# Patient Record
Sex: Male | Born: 1945
Health system: Southern US, Community
[De-identification: ages and names within clinical notes are randomized; demographics above are authoritative.]

## PROBLEM LIST (undated history)

## (undated) DIAGNOSIS — H269 Unspecified cataract: Secondary | ICD-10-CM

## (undated) DIAGNOSIS — J309 Allergic rhinitis, unspecified: Secondary | ICD-10-CM

## (undated) DIAGNOSIS — K469 Unspecified abdominal hernia without obstruction or gangrene: Secondary | ICD-10-CM

## (undated) DIAGNOSIS — E785 Hyperlipidemia, unspecified: Secondary | ICD-10-CM

## (undated) DIAGNOSIS — G4733 Obstructive sleep apnea (adult) (pediatric): Secondary | ICD-10-CM

## (undated) DIAGNOSIS — G4730 Sleep apnea, unspecified: Secondary | ICD-10-CM

## (undated) DIAGNOSIS — J45909 Unspecified asthma, uncomplicated: Secondary | ICD-10-CM

## (undated) DIAGNOSIS — J302 Other seasonal allergic rhinitis: Secondary | ICD-10-CM

## (undated) DIAGNOSIS — K219 Gastro-esophageal reflux disease without esophagitis: Secondary | ICD-10-CM

## (undated) DIAGNOSIS — F329 Major depressive disorder, single episode, unspecified: Secondary | ICD-10-CM

## (undated) DIAGNOSIS — F32A Depression, unspecified: Secondary | ICD-10-CM

## (undated) HISTORY — PX: CATARACT EXTRACTION, BILATERAL: SHX1313

## (undated) HISTORY — PX: FOOT SURGERY: SHX648

## (undated) HISTORY — DX: Gastro-esophageal reflux disease without esophagitis: K21.9

## (undated) HISTORY — DX: Unspecified abdominal hernia without obstruction or gangrene: K46.9

## (undated) HISTORY — DX: Other seasonal allergic rhinitis: J30.2

## (undated) HISTORY — PX: APPENDECTOMY: SHX54

## (undated) HISTORY — DX: Unspecified cataract: H26.9

## (undated) HISTORY — DX: Hyperlipidemia, unspecified: E78.5

## (undated) HISTORY — DX: Obstructive sleep apnea (adult) (pediatric): G47.33

## (undated) HISTORY — DX: Major depressive disorder, single episode, unspecified: F32.9

## (undated) HISTORY — DX: Allergic rhinitis, unspecified: J30.9

## (undated) HISTORY — PX: HERNIA REPAIR: SHX51

## (undated) HISTORY — DX: Unspecified asthma, uncomplicated: J45.909

## (undated) HISTORY — DX: Sleep apnea, unspecified: G47.30

## (undated) HISTORY — DX: Depression, unspecified: F32.A

---

## 1991-01-05 HISTORY — PX: THYROGLOSSAL DUCT CYST: SHX297

## 2010-02-17 ENCOUNTER — Ambulatory Visit: Payer: Self-pay | Admitting: Internal Medicine

## 2010-02-17 DIAGNOSIS — J019 Acute sinusitis, unspecified: Secondary | ICD-10-CM

## 2010-02-17 DIAGNOSIS — J309 Allergic rhinitis, unspecified: Secondary | ICD-10-CM

## 2010-08-27 ENCOUNTER — Encounter: Payer: Self-pay | Admitting: Internal Medicine

## 2010-08-27 ENCOUNTER — Ambulatory Visit (INDEPENDENT_AMBULATORY_CARE_PROVIDER_SITE_OTHER): Payer: Medicare Other | Admitting: Internal Medicine

## 2010-08-27 DIAGNOSIS — K4090 Unilateral inguinal hernia, without obstruction or gangrene, not specified as recurrent: Secondary | ICD-10-CM

## 2010-08-27 DIAGNOSIS — J309 Allergic rhinitis, unspecified: Secondary | ICD-10-CM

## 2010-08-27 DIAGNOSIS — G4730 Sleep apnea, unspecified: Secondary | ICD-10-CM

## 2010-08-27 DIAGNOSIS — E785 Hyperlipidemia, unspecified: Secondary | ICD-10-CM

## 2010-08-27 DIAGNOSIS — N400 Enlarged prostate without lower urinary tract symptoms: Secondary | ICD-10-CM

## 2010-08-27 DIAGNOSIS — F3289 Other specified depressive episodes: Secondary | ICD-10-CM

## 2010-08-27 DIAGNOSIS — F32A Depression, unspecified: Secondary | ICD-10-CM

## 2010-08-27 DIAGNOSIS — F329 Major depressive disorder, single episode, unspecified: Secondary | ICD-10-CM

## 2010-09-01 ENCOUNTER — Other Ambulatory Visit (INDEPENDENT_AMBULATORY_CARE_PROVIDER_SITE_OTHER): Payer: Self-pay | Admitting: Otolaryngology

## 2010-09-03 ENCOUNTER — Ambulatory Visit
Admission: RE | Admit: 2010-09-03 | Discharge: 2010-09-03 | Disposition: A | Discharge status: Home / Self Care | Payer: Medicare Other | Source: Ambulatory Visit | Attending: Otolaryngology | Admitting: Otolaryngology

## 2010-09-03 ENCOUNTER — Inpatient Hospital Stay: Admission: RE | Admit: 2010-09-03 | Payer: Medicare Other | Source: Ambulatory Visit

## 2010-09-27 DIAGNOSIS — E785 Hyperlipidemia, unspecified: Secondary | ICD-10-CM | POA: Insufficient documentation

## 2010-09-27 DIAGNOSIS — G4733 Obstructive sleep apnea (adult) (pediatric): Secondary | ICD-10-CM | POA: Insufficient documentation

## 2010-09-27 DIAGNOSIS — N400 Enlarged prostate without lower urinary tract symptoms: Secondary | ICD-10-CM | POA: Insufficient documentation

## 2010-09-27 DIAGNOSIS — J309 Allergic rhinitis, unspecified: Secondary | ICD-10-CM | POA: Insufficient documentation

## 2010-09-27 NOTE — Progress Notes (Signed)
  Subjective:    Patient ID: Jeffrey Wu., male    DOB: 1945/11/21, 65 y.o.   MRN: [id number]  HPI 65 year old white male who denies his time between here and his home in [place] presents with complaint of groin pain. Is under the care of Dr. Corinda Gubler for chronic sinusitis and allergic rhinitis. He is retired. Used to take allergy injections. Was evaluated initially February 2012 with positive skin test to dust mite, slight reaction to grass, significant reaction to red cedar and weeds and tree mix. Has been prescribed Nasonex nasal spray. Had thyroglossal duct cyst removed around 1990. Appendectomy 1955. Hernia repair 2009. Currently on Paxil and Crestor. Had sigmoidoscopy approximately 1990 in [place]. History of obstructive sleep apnea for which CPAP as been prescribed by his physician in [place]. History of depression and hyperlipidemia and recurrent urination at night. Previous hernia was in the right inguinal area according to old records. History of BPH for which Flomax has been prescribed in the past. Quit smoking July 2009. Smoked a pack a day for some 46 years. Social alcohol consumption consisting of 3 ounces of alcohol daily for approximately 50 years. He has a Event organiser in business. He is married. Has 3 adult children 2 sons and a daughter.  Father died at age 61 of leukemia. Patient was 65 years old at the time. Mother died at age 65 due to a stroke after heart surgery. One brother in good health. One sister in good health. Wife is in good health. They're planning to move here to live permanently in [place] whenever they get their house in [place] downsized. Patient's mother had history of diabetes. Their son and daughter-in-law live here now.  Patient has noticed  discomfort in the inguinal region.    Review of Systems     Objective:   Physical Exam fullness in inguinal area. Hernia felt on direct palpation.        Assessment & Plan:  Inguinal  hernia  Hyperlipidemia  Depression  BPH  Allergic rhinitis  Sleep apnea  Plan: Refer to surgeon for further evaluation

## 2010-12-09 HISTORY — PX: NASAL SINUS SURGERY: SHX719

## 2010-12-22 ENCOUNTER — Encounter: Payer: Self-pay | Admitting: Internal Medicine

## 2010-12-22 ENCOUNTER — Ambulatory Visit (INDEPENDENT_AMBULATORY_CARE_PROVIDER_SITE_OTHER): Payer: Medicare Other | Admitting: Internal Medicine

## 2010-12-22 DIAGNOSIS — J309 Allergic rhinitis, unspecified: Secondary | ICD-10-CM

## 2010-12-22 DIAGNOSIS — K4090 Unilateral inguinal hernia, without obstruction or gangrene, not specified as recurrent: Secondary | ICD-10-CM

## 2010-12-22 DIAGNOSIS — G4730 Sleep apnea, unspecified: Secondary | ICD-10-CM

## 2010-12-22 NOTE — Patient Instructions (Signed)
We will make appointment for you to see Dr. Shelle Iron for sleep apnea, please contact [facility] to have [facility]AP apparatus checked. We will make an appointment for you to see general surgeon regarding inguinal hernia.

## 2010-12-22 NOTE — Progress Notes (Signed)
Patient ID: Jeffrey Wu., male   DOB: 02/01/1945, 65 y.o.   MRN: [id number]   Patient has not been seen here since August. In the interim, he has had allergy testing at [facility]. His wife is here today for an office visit and she relates to me that he has several requests. He apparently recently had sinus surgery because sinus infections could not be cleared up with medication per allergist. When he was seen in August, he was complaining of inguinal hernia discomfort. He is now ready to see surgeon about this. History of right inguinal hernia repair 2009. We will arrange for consultation after the first of the year. He also has a history of sleep apnea. He has moved here from [place] where CPAP was originally prescribed and does not feel that the CPAP apparatus is working correctly. I have given his wife today an order for them to call [facility] and have an evaluation of the CPAP apparatus. We will also make a consultation appointment for him to see Dr. Shelle Iron regarding his sleep apnea.

## 2010-12-23 ENCOUNTER — Telehealth: Payer: Self-pay

## 2010-12-23 DIAGNOSIS — G4730 Sleep apnea, unspecified: Secondary | ICD-10-CM

## 2010-12-23 DIAGNOSIS — K4090 Unilateral inguinal hernia, without obstruction or gangrene, not specified as recurrent: Secondary | ICD-10-CM

## 2010-12-23 NOTE — Telephone Encounter (Signed)
Patient scheduled for appointment with Dr. Loretha Brasil on January 10,2013 at 3:20 pm for evaluation of inguinal hernias, and appointment with Dr. Shelle Iron on January 06, 2011 at 4:00pm for sleep apnea. Patient aware of these appointments.

## 2011-01-06 ENCOUNTER — Ambulatory Visit (INDEPENDENT_AMBULATORY_CARE_PROVIDER_SITE_OTHER): Payer: Medicare Other | Admitting: Pulmonary Disease

## 2011-01-06 ENCOUNTER — Encounter: Payer: Self-pay | Admitting: Pulmonary Disease

## 2011-01-06 VITALS — BP 110/76 | HR 60 | Temp 98.2°F | Ht 72.0 in | Wt 219.2 lb

## 2011-01-06 DIAGNOSIS — G4733 Obstructive sleep apnea (adult) (pediatric): Secondary | ICD-10-CM

## 2011-01-06 DIAGNOSIS — F329 Major depressive disorder, single episode, unspecified: Secondary | ICD-10-CM

## 2011-01-06 DIAGNOSIS — F32A Depression, unspecified: Secondary | ICD-10-CM

## 2011-01-06 DIAGNOSIS — F3289 Other specified depressive episodes: Secondary | ICD-10-CM

## 2011-01-06 NOTE — Patient Instructions (Signed)
Will get you setup with a dme for supplies and service Will reoptimize your cpap pressure at home with an auto device, and will call you with results.  Work on weight loss followup with me in 1 year if doing well.

## 2011-01-06 NOTE — Progress Notes (Signed)
  Subjective:    Patient ID: Jeffrey Mealy., male    DOB: Apr 21, 1945, 66 y.o.   MRN: [id number]  HPI The patient is a 66 year old male who had been asked to see for management of obstructive sleep apnea.  He was diagnosed around 2010 in [place] with sleep apnea, and has been on CPAP since that time.  He uses a full face mask, and his current mask is only 59 months old.  He is unaware of his current CPAP pressure.  He feels that he has done very well with the device, and that it has helped his sleep and daytime alertness.  The last few months he does not feel as rested in the mornings upon arising, but has had sleep onset issues since moving to the area.  He feels that his alertness during the day is quite adequate with reading or watching television, but can have issues with the computer.  He denies any issues with sleepiness while driving.  The patient states that his weight is about 25 pounds up since his sleep study, and his Epworth score today is 7.  Sleep Questionnaire: What time do you typically go to bed?( Between what hours) 11pm How long does it take you to fall asleep? varies How many times during the night do you wake up? 2 What time do you get out of bed to start your day? 0700 Do you drive or operate heavy machinery in your occupation? No How much has your weight changed (up or down) over the past two years? (In pounds) 15 lb (6.804 kg) Have you ever had a sleep study before? Yes If yes, location of study? [facility] in [place], [place] If yes, date of study? 2010 Do you currently use CPAP? Yes If so, what pressure? unsure Do you [place] oxygen at any time? No     Review of Systems  Constitutional: Negative for fever and unexpected weight change.  HENT: Positive for ear pain, congestion and sneezing. Negative for nosebleeds, sore throat, rhinorrhea, trouble swallowing, dental problem, postnasal drip and sinus pressure.   Eyes: Negative for redness and itching.  Respiratory: Negative  for cough, chest tightness, shortness of breath and wheezing.   Cardiovascular: Negative for palpitations and leg swelling.  Gastrointestinal: Negative for nausea and vomiting.  Genitourinary: Negative for dysuria.  Musculoskeletal: Negative for joint swelling.  Skin: Negative for rash.  Neurological: Negative for headaches.  Hematological: Does not bruise/bleed easily.  Psychiatric/Behavioral: Negative for dysphoric mood. The patient is not nervous/anxious.        Objective:   Physical Exam Constitutional:  Well developed, no acute distress  HENT:  Nares patent without discharge, mild crusting on left  Oropharynx without exudate, palate and uvula are elongated.  Eyes:  Perrla, eomi, no scleral icterus  Neck:  No JVD, no TMG  Cardiovascular:  Normal rate, regular rhythm, no rubs or gallops.  No murmurs        Intact distal pulses  Pulmonary :  Normal breath sounds, no stridor or respiratory distress   No rales, rhonchi, or wheezing  Abdominal:  Soft, nondistended, bowel sounds present.  No tenderness noted.   Musculoskeletal:  No lower extremity edema noted.  Lymph Nodes:  No cervical lymphadenopathy noted  Skin:  No cyanosis noted  Neurologic:  Alert, appropriate, moves all 4 extremities without obvious deficit.         Assessment & Plan:

## 2011-01-06 NOTE — Assessment & Plan Note (Signed)
The patient has a history of objective sleep apnea, and feels that he has been treated successfully with CPAP.  He currently feels that his alertness during the day is adequate, but does have some sleepiness at times.  His weight has increased 25 pounds or more since his last sleep study, and I would like to take this opportunity to optimize his pressure again.  Otherwise, he feels he is doing well with the device, and I have encouraged him to work aggressively on weight loss.  He also needs to keep up with the CPAP supplies and mask changes.

## 2011-01-08 ENCOUNTER — Telehealth: Payer: Self-pay | Admitting: Pulmonary Disease

## 2011-01-08 NOTE — Telephone Encounter (Signed)
Received copies from [facility],on 01/08/11. Forwarded 10pages to Dr. Reva Bores review.

## 2011-01-13 DIAGNOSIS — H6520 Chronic serous otitis media, unspecified ear: Secondary | ICD-10-CM | POA: Diagnosis not present

## 2011-01-13 DIAGNOSIS — J320 Chronic maxillary sinusitis: Secondary | ICD-10-CM | POA: Diagnosis not present

## 2011-01-13 DIAGNOSIS — J330 Polyp of nasal cavity: Secondary | ICD-10-CM | POA: Diagnosis not present

## 2011-01-13 DIAGNOSIS — H6980 Other specified disorders of Eustachian tube, unspecified ear: Secondary | ICD-10-CM | POA: Diagnosis not present

## 2011-01-13 DIAGNOSIS — H908 Mixed conductive and sensorineural hearing loss, unspecified: Secondary | ICD-10-CM | POA: Diagnosis not present

## 2011-01-14 ENCOUNTER — Ambulatory Visit (INDEPENDENT_AMBULATORY_CARE_PROVIDER_SITE_OTHER): Payer: Medicare Other | Admitting: Surgery

## 2011-01-14 ENCOUNTER — Encounter (INDEPENDENT_AMBULATORY_CARE_PROVIDER_SITE_OTHER): Payer: Self-pay | Admitting: Surgery

## 2011-01-14 VITALS — BP 132/84 | HR 64 | Temp 96.8°F | Resp 16 | Ht 73.0 in | Wt 211.8 lb

## 2011-01-14 DIAGNOSIS — K429 Umbilical hernia without obstruction or gangrene: Secondary | ICD-10-CM | POA: Diagnosis not present

## 2011-01-14 DIAGNOSIS — K4091 Unilateral inguinal hernia, without obstruction or gangrene, recurrent: Secondary | ICD-10-CM

## 2011-01-14 NOTE — Progress Notes (Signed)
Patient ID: Jeffrey Wu., male   DOB: 02/18/1945, 66 y.o.   MRN: [id number]  Chief Complaint  Patient presents with  . Other    new pt- eval hernias    HPI Jeffrey Wu. is a 66 y.o. male.   HPIThis is a very pleasant gentleman referred by Dr. Lenord Fellers for evaluation of her recurrent inguinal hernia. The patient reports he had inguinal hernia repair several years ago and has now recurred. He is now also noticed a bulge in his umbilicus. He is having moderate discomfort from time to time in the right groin. The bulge always easily reduces and is quite small. He has had no nausea or vomiting or obstructive symptoms. He is otherwise without complaints  Past Medical History  Diagnosis Date  . Atopic rhinitis   . Chronic sinusitis   . Hyperlipidemia   . OSA (obstructive sleep apnea)   . Hernia   . Hemorrhoids     Past Surgical History  Procedure Date  . Appendectomy   . Hernia repair   . Foot surgery     Left  . Nasal sinus surgery 12/09/10  . Thyroglossal duct cyst 1993    Family History  Problem Relation Age of Onset  . Cancer Father     leukemia  . Heart disease Mother   . Stroke Mother     Social History History  Substance Use Topics  . Smoking status: Former Smoker -- 1.0 packs/day for 50 years    Types: Cigarettes    Quit date: 01/05/2007  . Smokeless tobacco: Not on file  . Alcohol Use: Yes    No Known Allergies  Current Outpatient Prescriptions  Medication Sig Dispense Refill  . azelastine (ASTELIN) 137 MCG/SPRAY nasal spray Place 1 spray into the nose daily. Use in each nostril as directed      . Dutasteride-Tamsulosin HCl (JALYN PO) Take by mouth daily.       . fexofenadine (ALLEGRA) 180 MG tablet Take 180 mg by mouth daily.        . methylPREDNISolone (MEDROL) 16 MG tablet Take 16 mg by mouth daily.      . mometasone (NASONEX) 50 MCG/ACT nasal spray Place 2 sprays into the nose daily.        Marland Kitchen PARoxetine (PAXIL) 10 MG tablet Take 10 mg by mouth  every morning.        . rosuvastatin (CRESTOR) 10 MG tablet Take 10 mg by mouth daily.          Review of Systems Review of Systems  Constitutional: Negative for fever, chills and unexpected weight change.  HENT: Negative for hearing loss, congestion, sore throat, trouble swallowing and voice change.   Eyes: Negative for visual disturbance.  Respiratory: Negative for cough and wheezing.   Cardiovascular: Negative for chest pain, palpitations and leg swelling.  Gastrointestinal: Negative for nausea, vomiting, abdominal pain, diarrhea, constipation, blood in stool, abdominal distention, anal bleeding and rectal pain.  Genitourinary: Negative for hematuria and difficulty urinating.  Musculoskeletal: Negative for arthralgias.  Skin: Negative for rash and wound.  Neurological: Negative for seizures, syncope, weakness and headaches.  Hematological: Negative for adenopathy. Does not bruise/bleed easily.  Psychiatric/Behavioral: Negative for confusion.    Blood pressure 132/84, pulse 64, temperature 96.8 F (36 C), temperature source Temporal, resp. rate 16, height 6\' 1"  (1.854 m), weight 211 lb 12.8 oz (96.072 kg).  Physical Exam Physical Exam  Constitutional: He is oriented to person, place, and time. He appears  well-developed and well-nourished. No distress.  HENT:  Head: Normocephalic and atraumatic.  Right Ear: External ear normal.  Left Ear: External ear normal.  Nose: Nose normal.  Mouth/Throat: Oropharynx is clear and moist.  Eyes: Conjunctivae and EOM are normal. Pupils are equal, round, and reactive to light. No scleral icterus.  Neck: Normal range of motion. Neck supple. No tracheal deviation present. No thyromegaly present.  Cardiovascular: Normal rate, regular rhythm, normal heart sounds and intact distal pulses.   No murmur heard. Pulmonary/Chest: Effort normal and breath sounds normal.  Abdominal: Soft. Bowel sounds are normal. He exhibits no distension. There is no  tenderness. There is no rebound.       He has a small reducible hernia at the umbilicus. There is also a small recurrent right inguinal hernia. I cannot palpate a left inguinal hernia  Musculoskeletal: Normal range of motion. He exhibits no edema and no tenderness.  Lymphadenopathy:    He has no cervical adenopathy.  Neurological: He is alert and oriented to person, place, and time.  Skin: Skin is warm and dry. No rash noted. He is not diaphoretic. No pallor.  Psychiatric: His behavior is normal. Judgment normal.    Data Reviewed   Assessment    Recurrent right inguinal hernia and umbilical hernia    Plan    Repair with mesh was recommended for the inguinal hernia. As it is recurrent I am recommending laparoscopic repair. We will also fixed the umbilicus at that same time. I discussed the risk of surgery which includes but is not limited to bleeding, infection, nerve entrapment with chronic pain, recurrence, injury to surrounding structures, etc. He will call back to schedule surgery. Likelihood of success is good       Deneise Getty A 01/14/2011, 4:14 PM

## 2011-01-20 DIAGNOSIS — H6980 Other specified disorders of Eustachian tube, unspecified ear: Secondary | ICD-10-CM | POA: Diagnosis not present

## 2011-01-20 DIAGNOSIS — H908 Mixed conductive and sensorineural hearing loss, unspecified: Secondary | ICD-10-CM | POA: Diagnosis not present

## 2011-01-20 DIAGNOSIS — H6520 Chronic serous otitis media, unspecified ear: Secondary | ICD-10-CM | POA: Diagnosis not present

## 2011-01-29 ENCOUNTER — Encounter: Payer: Self-pay | Admitting: Pulmonary Disease

## 2011-02-15 ENCOUNTER — Telehealth: Payer: Self-pay | Admitting: Internal Medicine

## 2011-02-15 ENCOUNTER — Other Ambulatory Visit: Payer: Self-pay

## 2011-02-15 DIAGNOSIS — E785 Hyperlipidemia, unspecified: Secondary | ICD-10-CM

## 2011-02-15 MED ORDER — ROSUVASTATIN CALCIUM 10 MG PO TABS: 10.0000 mg | ORAL_TABLET | Freq: Every day | ORAL | Status: DC

## 2011-02-15 MED ORDER — PAROXETINE HCL 10 MG PO TABS: 10.0000 mg | ORAL_TABLET | ORAL | Status: DC

## 2011-02-15 NOTE — Telephone Encounter (Signed)
Call in # 30 of each with 2 refills. Pt must be seen here for lipid liver and OV when he returns.

## 2011-02-28 ENCOUNTER — Other Ambulatory Visit: Payer: Self-pay | Admitting: Pulmonary Disease

## 2011-02-28 DIAGNOSIS — G4733 Obstructive sleep apnea (adult) (pediatric): Secondary | ICD-10-CM

## 2011-03-11 ENCOUNTER — Telehealth (INDEPENDENT_AMBULATORY_CARE_PROVIDER_SITE_OTHER): Payer: Self-pay | Admitting: Surgery

## 2011-03-11 NOTE — Telephone Encounter (Signed)
Patient was last seen on 01/14/11 for BIH, did not wish to schedule sx at that time, Dr. Dorthea Cove instructed the patient to call and schedule sx when ready.  Don't know if there are any orders or if any orders can be written for sx, or if the patient needs to come back and see Dr. Dorthea Cove again.  Patient is in [place] and will be back in [place] on 04/05/11.

## 2011-03-19 DIAGNOSIS — R0989 Other specified symptoms and signs involving the circulatory and respiratory systems: Secondary | ICD-10-CM | POA: Diagnosis not present

## 2011-03-19 DIAGNOSIS — J3489 Other specified disorders of nose and nasal sinuses: Secondary | ICD-10-CM | POA: Diagnosis not present

## 2011-03-19 DIAGNOSIS — R0602 Shortness of breath: Secondary | ICD-10-CM | POA: Diagnosis not present

## 2011-03-19 DIAGNOSIS — R062 Wheezing: Secondary | ICD-10-CM | POA: Diagnosis not present

## 2011-04-06 ENCOUNTER — Other Ambulatory Visit: Payer: Self-pay | Admitting: Internal Medicine

## 2011-04-06 NOTE — Telephone Encounter (Signed)
When is patient booked to come in? He is past due and we recently refilled meds to out of state home where he also resides. Needs OV in next several weeks to continue to refill meds. No PE available until May or JUne.

## 2011-04-07 ENCOUNTER — Other Ambulatory Visit: Payer: Self-pay

## 2011-04-07 DIAGNOSIS — J3089 Other allergic rhinitis: Secondary | ICD-10-CM | POA: Diagnosis not present

## 2011-04-07 DIAGNOSIS — J019 Acute sinusitis, unspecified: Secondary | ICD-10-CM | POA: Diagnosis not present

## 2011-04-07 DIAGNOSIS — J301 Allergic rhinitis due to pollen: Secondary | ICD-10-CM | POA: Diagnosis not present

## 2011-04-07 DIAGNOSIS — R0602 Shortness of breath: Secondary | ICD-10-CM | POA: Diagnosis not present

## 2011-04-08 ENCOUNTER — Other Ambulatory Visit: Payer: Self-pay

## 2011-04-08 MED ORDER — DUTASTERIDE-TAMSULOSIN HCL 0.5-0.4 MG PO CAPS: 0.4000 mg | ORAL_CAPSULE | Freq: Every day | ORAL | Status: DC

## 2011-04-12 ENCOUNTER — Other Ambulatory Visit: Payer: Medicare Other | Admitting: Internal Medicine

## 2011-04-12 DIAGNOSIS — Z79899 Other long term (current) drug therapy: Secondary | ICD-10-CM | POA: Diagnosis not present

## 2011-04-12 DIAGNOSIS — N400 Enlarged prostate without lower urinary tract symptoms: Secondary | ICD-10-CM

## 2011-04-12 DIAGNOSIS — E785 Hyperlipidemia, unspecified: Secondary | ICD-10-CM | POA: Diagnosis not present

## 2011-04-12 DIAGNOSIS — H7200 Central perforation of tympanic membrane, unspecified ear: Secondary | ICD-10-CM | POA: Diagnosis not present

## 2011-04-12 DIAGNOSIS — J320 Chronic maxillary sinusitis: Secondary | ICD-10-CM | POA: Diagnosis not present

## 2011-04-12 DIAGNOSIS — H903 Sensorineural hearing loss, bilateral: Secondary | ICD-10-CM | POA: Diagnosis not present

## 2011-04-12 DIAGNOSIS — Z125 Encounter for screening for malignant neoplasm of prostate: Secondary | ICD-10-CM | POA: Diagnosis not present

## 2011-04-12 DIAGNOSIS — H6980 Other specified disorders of Eustachian tube, unspecified ear: Secondary | ICD-10-CM | POA: Diagnosis not present

## 2011-04-12 LAB — COMPREHENSIVE METABOLIC PANEL
ALT: 23 U/L (ref 0–53)
AST: 20 U/L (ref 0–37)
BUN: 16 mg/dL (ref 6–23)
Calcium: 9.4 mg/dL (ref 8.4–10.5)
Creat: 1.03 mg/dL (ref 0.50–1.35)
Total Bilirubin: 0.5 mg/dL (ref 0.3–1.2)

## 2011-04-12 LAB — CBC WITH DIFFERENTIAL/PLATELET
Basophils Absolute: 0 10*3/uL (ref 0.0–0.1)
Eosinophils Absolute: 0.2 10*3/uL (ref 0.0–0.7)
Eosinophils Relative: 2 % (ref 0–5)
HCT: 46.6 % (ref 39.0–52.0)
MCH: 30.9 pg (ref 26.0–34.0)
MCHC: 31.5 g/dL (ref 30.0–36.0)
MCV: 97.9 fL (ref 78.0–100.0)
Monocytes Absolute: 0.7 10*3/uL (ref 0.1–1.0)
Platelets: 187 10*3/uL (ref 150–400)
RDW: 13.5 % (ref 11.5–15.5)

## 2011-04-12 LAB — LIPID PANEL
Cholesterol: 197 mg/dL (ref 0–200)
HDL: 75 mg/dL (ref >39)
Total CHOL/HDL Ratio: 2.6 Ratio
VLDL: 18 mg/dL (ref 0–40)

## 2011-04-12 LAB — PSA, MEDICARE: PSA: 0.37 ng/mL (ref <=4.00)

## 2011-04-13 ENCOUNTER — Encounter: Payer: Self-pay | Admitting: Internal Medicine

## 2011-04-13 ENCOUNTER — Ambulatory Visit (INDEPENDENT_AMBULATORY_CARE_PROVIDER_SITE_OTHER): Payer: Medicare Other | Admitting: Internal Medicine

## 2011-04-13 VITALS — BP 128/68 | HR 60 | Temp 98.1°F | Ht 72.0 in | Wt 220.0 lb

## 2011-04-13 DIAGNOSIS — J309 Allergic rhinitis, unspecified: Secondary | ICD-10-CM

## 2011-04-13 DIAGNOSIS — F329 Major depressive disorder, single episode, unspecified: Secondary | ICD-10-CM

## 2011-04-13 DIAGNOSIS — G4733 Obstructive sleep apnea (adult) (pediatric): Secondary | ICD-10-CM

## 2011-04-13 DIAGNOSIS — I10 Essential (primary) hypertension: Secondary | ICD-10-CM | POA: Diagnosis not present

## 2011-04-13 DIAGNOSIS — F3289 Other specified depressive episodes: Secondary | ICD-10-CM

## 2011-04-13 DIAGNOSIS — Z23 Encounter for immunization: Secondary | ICD-10-CM

## 2011-04-13 DIAGNOSIS — N400 Enlarged prostate without lower urinary tract symptoms: Secondary | ICD-10-CM

## 2011-04-13 DIAGNOSIS — E785 Hyperlipidemia, unspecified: Secondary | ICD-10-CM

## 2011-04-13 DIAGNOSIS — F32A Depression, unspecified: Secondary | ICD-10-CM

## 2011-04-13 MED ORDER — PNEUMOCOCCAL VAC POLYVALENT 25 MCG/0.5ML IJ INJ: 0.5000 mL | INJECTION | INTRAMUSCULAR | Status: AC

## 2011-04-14 ENCOUNTER — Other Ambulatory Visit: Payer: Self-pay | Admitting: Internal Medicine

## 2011-04-22 DIAGNOSIS — K429 Umbilical hernia without obstruction or gangrene: Secondary | ICD-10-CM | POA: Diagnosis not present

## 2011-04-22 DIAGNOSIS — K4090 Unilateral inguinal hernia, without obstruction or gangrene, not specified as recurrent: Secondary | ICD-10-CM | POA: Diagnosis not present

## 2011-04-22 HISTORY — PX: INGUINAL HERNIA REPAIR: SUR1180

## 2011-04-26 ENCOUNTER — Encounter (INDEPENDENT_AMBULATORY_CARE_PROVIDER_SITE_OTHER): Payer: Self-pay

## 2011-04-27 ENCOUNTER — Telehealth: Payer: Self-pay | Admitting: Pulmonary Disease

## 2011-04-27 NOTE — Telephone Encounter (Signed)
Would be happy to look at download if you can track down.  Thanks.

## 2011-04-27 NOTE — Telephone Encounter (Signed)
Called, spoke with [name] who states pt came in today for a pressure change to 15.  [name] seen a note for a download, so he done this first.  Per [name], in the last 30 days, the AHI = 3.3 with pressure at 9.  [name] reports that pt was having high leaks from mask not fitting properly during titration study.  Therefore, today he refitted pt for a mask and tubing and did NOT change pressure to 15.  He is requesting KC look at this download, which [name] states he did send, and advise if KC would still like pressure to be changed.  Dr. Shelle Iron, pls advise.  Thank you.

## 2011-04-27 NOTE — Telephone Encounter (Signed)
Called and spoke with Jeffrey Wu and he is going to fax download to triage fax #. Awaiting fax.

## 2011-04-28 ENCOUNTER — Encounter (INDEPENDENT_AMBULATORY_CARE_PROVIDER_SITE_OTHER): Payer: Self-pay | Admitting: General Surgery

## 2011-04-28 ENCOUNTER — Ambulatory Visit (INDEPENDENT_AMBULATORY_CARE_PROVIDER_SITE_OTHER): Payer: Medicare Other | Admitting: Pulmonary Disease

## 2011-04-28 DIAGNOSIS — G4733 Obstructive sleep apnea (adult) (pediatric): Secondary | ICD-10-CM

## 2011-04-28 NOTE — Telephone Encounter (Signed)
Now that he has a mask that fits, I think he needs to have another auto study on 5-20cm for 2 weeks with download to me. I will send an order to [facility].  Please let pt know.

## 2011-04-28 NOTE — Telephone Encounter (Signed)
Received download and put in [facility] folder

## 2011-04-28 NOTE — Progress Notes (Signed)
Order only

## 2011-04-28 NOTE — Telephone Encounter (Signed)
ATC pt- na wcb 

## 2011-04-28 NOTE — Telephone Encounter (Signed)
I don't see download on triage fax. Aundra Millet did you receive the download. Please advise thanks

## 2011-04-29 NOTE — Telephone Encounter (Signed)
Spoke with pt and notified of recs per KC He verbalized understanding and denied any questions 

## 2011-04-29 NOTE — Telephone Encounter (Signed)
LMTCB

## 2011-05-02 NOTE — Patient Instructions (Signed)
Continue same medications and make appointment for physical examination in 6 months. Please try to determine when her last colonoscopy was in [place] that report.

## 2011-05-02 NOTE — Progress Notes (Signed)
  Subjective:    Patient ID: Jeffrey Wu., male    DOB: 01/25/1945, 66 y.o.   MRN: [id number]  HPI 66 year old white male who lives here in [place] now but has lived in both [place] and [place] for the past year or so. Has been trying to sell home and [place]. He is married. History of BPH, depression, hypertension. Has allergic rhinitis and sees allergist at [facility]. Patient says he has had a colonoscopy and [place] but I do not have that report. He says no polyps were found in says study was done around 1990. I've asked him to try to track that report down for me with the date and findings. History of obstructive sleep apnea uses CPAP. This was diagnosed in 2009 in [place], [place]. He is a former smoker. Quit smoking around 2010 and formerly smoked a pack a day for some 46 years. Consumes 3 ounces of alcohol daily for some 50 years.  Was allergy tested here in [facility] February 2012 his skin test were positive to dust mite, grass, red cedar and weeds. Also allergic to tree mix on intradermal testing.  Father died at age 66 of cancer. Says it was from leukemia and patient was 66 years old at the time. Mother died at age 64 of a stroke after heart surgery. One brother and one sister in good health. 3 adult children 2 sons and a daughter all of whom are in good health. He has grandchildren.   Review of Systems     Objective:   Physical Exam neck is supple without JVD thyromegaly or carotid bruits. Chest clear to auscultation. Cardiac exam regular rate and rhythm normal S1 and S2; extremities without edema.         Assessment & Plan:  Hypertension  BPH  Depression  Allergic rhinitis  Obstructive sleep apnea  Plan: Patient was told [facility] appointment for physical examination in 6 months. We have refilled his chronic medications today but he needs to have fasting lab work. Given tetanus immunization in left arm and Pneumovax immunization in right arm  today.

## 2011-05-04 ENCOUNTER — Encounter (INDEPENDENT_AMBULATORY_CARE_PROVIDER_SITE_OTHER): Payer: Self-pay | Admitting: Surgery

## 2011-05-04 ENCOUNTER — Ambulatory Visit (INDEPENDENT_AMBULATORY_CARE_PROVIDER_SITE_OTHER): Payer: Medicare Other | Admitting: Surgery

## 2011-05-04 VITALS — BP 122/68 | HR 78 | Resp 18 | Ht 73.0 in | Wt 215.0 lb

## 2011-05-04 DIAGNOSIS — Z09 Encounter for follow-up examination after completed treatment for conditions other than malignant neoplasm: Secondary | ICD-10-CM

## 2011-05-04 NOTE — Progress Notes (Signed)
Subjective:     Patient ID: Jeffrey Wu., male   DOB: 04/03/1945, 66 y.o.   MRN: [id number]  HPI He is here for his first postop visit status post laparoscopic right inguinal hernia repair with mesh as well as umbilical hernia repair. He is doing well and has no complaints.  Review of Systems     Objective:   Physical Exam On exam, his incisions are healing well. There is no evidence of recurrent hernia at either site    Assessment:     Patient doing well status post hernia repair    Plan:     He will refrain for heavy lifting for another week and a half. I will see him back as needed

## 2011-05-10 DIAGNOSIS — D1801 Hemangioma of skin and subcutaneous tissue: Secondary | ICD-10-CM | POA: Diagnosis not present

## 2011-05-10 DIAGNOSIS — L570 Actinic keratosis: Secondary | ICD-10-CM | POA: Diagnosis not present

## 2011-05-10 DIAGNOSIS — L820 Inflamed seborrheic keratosis: Secondary | ICD-10-CM | POA: Diagnosis not present

## 2011-05-11 DIAGNOSIS — J45909 Unspecified asthma, uncomplicated: Secondary | ICD-10-CM | POA: Diagnosis not present

## 2011-05-11 DIAGNOSIS — J301 Allergic rhinitis due to pollen: Secondary | ICD-10-CM | POA: Diagnosis not present

## 2011-05-11 DIAGNOSIS — R0602 Shortness of breath: Secondary | ICD-10-CM | POA: Diagnosis not present

## 2011-05-11 DIAGNOSIS — J3089 Other allergic rhinitis: Secondary | ICD-10-CM | POA: Diagnosis not present

## 2011-06-08 DIAGNOSIS — R05 Cough: Secondary | ICD-10-CM | POA: Diagnosis not present

## 2011-06-08 DIAGNOSIS — J019 Acute sinusitis, unspecified: Secondary | ICD-10-CM | POA: Diagnosis not present

## 2011-06-08 DIAGNOSIS — R059 Cough, unspecified: Secondary | ICD-10-CM | POA: Diagnosis not present

## 2011-06-13 ENCOUNTER — Other Ambulatory Visit: Payer: Self-pay | Admitting: Internal Medicine

## 2011-06-15 DIAGNOSIS — J209 Acute bronchitis, unspecified: Secondary | ICD-10-CM | POA: Diagnosis not present

## 2011-06-15 DIAGNOSIS — J069 Acute upper respiratory infection, unspecified: Secondary | ICD-10-CM | POA: Diagnosis not present

## 2011-06-18 DIAGNOSIS — J438 Other emphysema: Secondary | ICD-10-CM | POA: Diagnosis not present

## 2011-06-18 DIAGNOSIS — J309 Allergic rhinitis, unspecified: Secondary | ICD-10-CM | POA: Diagnosis not present

## 2011-06-18 DIAGNOSIS — R0609 Other forms of dyspnea: Secondary | ICD-10-CM | POA: Diagnosis not present

## 2011-06-18 DIAGNOSIS — J209 Acute bronchitis, unspecified: Secondary | ICD-10-CM | POA: Diagnosis not present

## 2011-07-14 DIAGNOSIS — B999 Unspecified infectious disease: Secondary | ICD-10-CM | POA: Diagnosis not present

## 2011-07-14 DIAGNOSIS — J301 Allergic rhinitis due to pollen: Secondary | ICD-10-CM | POA: Diagnosis not present

## 2011-07-14 DIAGNOSIS — J45901 Unspecified asthma with (acute) exacerbation: Secondary | ICD-10-CM | POA: Diagnosis not present

## 2011-07-14 DIAGNOSIS — J3089 Other allergic rhinitis: Secondary | ICD-10-CM | POA: Diagnosis not present

## 2011-07-14 DIAGNOSIS — J45909 Unspecified asthma, uncomplicated: Secondary | ICD-10-CM | POA: Diagnosis not present

## 2011-07-14 DIAGNOSIS — J019 Acute sinusitis, unspecified: Secondary | ICD-10-CM | POA: Diagnosis not present

## 2011-07-27 DIAGNOSIS — B999 Unspecified infectious disease: Secondary | ICD-10-CM | POA: Diagnosis not present

## 2011-08-05 DIAGNOSIS — J45909 Unspecified asthma, uncomplicated: Secondary | ICD-10-CM | POA: Diagnosis not present

## 2011-08-05 DIAGNOSIS — J3089 Other allergic rhinitis: Secondary | ICD-10-CM | POA: Diagnosis not present

## 2011-08-05 DIAGNOSIS — B999 Unspecified infectious disease: Secondary | ICD-10-CM | POA: Diagnosis not present

## 2011-08-05 DIAGNOSIS — J301 Allergic rhinitis due to pollen: Secondary | ICD-10-CM | POA: Diagnosis not present

## 2011-08-08 ENCOUNTER — Other Ambulatory Visit: Payer: Self-pay | Admitting: Pulmonary Disease

## 2011-08-08 DIAGNOSIS — G4733 Obstructive sleep apnea (adult) (pediatric): Secondary | ICD-10-CM

## 2011-08-11 ENCOUNTER — Telehealth: Payer: Self-pay | Admitting: Pulmonary Disease

## 2011-08-11 DIAGNOSIS — G4733 Obstructive sleep apnea (adult) (pediatric): Secondary | ICD-10-CM

## 2011-08-11 NOTE — Telephone Encounter (Signed)
Spoke with Jeffrey Wu, In the process of changing CPAP pressure to 12cm as ordered, pts machine messed up and will not allow the unit to be set lower than 25. [facility] has supplied a loner machine to pt until we can get an Rx/order sent in for pt to have a new replacement machine; set at 12cm with humidifer. [name] also states that they need the Rx/order to be signed by [facility] and have his license number attached.  Order placed for replacement machine and routed to [facility].

## 2011-08-11 NOTE — Telephone Encounter (Signed)
Order given to [facility] to replace cpap and set on 12cm and get climate controlled tubing Tobe Sos

## 2011-08-13 DIAGNOSIS — B999 Unspecified infectious disease: Secondary | ICD-10-CM | POA: Diagnosis not present

## 2011-09-03 DIAGNOSIS — H2510 Age-related nuclear cataract, unspecified eye: Secondary | ICD-10-CM | POA: Diagnosis not present

## 2011-09-03 DIAGNOSIS — H524 Presbyopia: Secondary | ICD-10-CM | POA: Diagnosis not present

## 2011-09-03 DIAGNOSIS — H5200 Hypermetropia, unspecified eye: Secondary | ICD-10-CM | POA: Diagnosis not present

## 2011-09-03 DIAGNOSIS — H52229 Regular astigmatism, unspecified eye: Secondary | ICD-10-CM | POA: Diagnosis not present

## 2011-09-09 ENCOUNTER — Other Ambulatory Visit: Payer: Self-pay | Admitting: Internal Medicine

## 2011-09-13 DIAGNOSIS — J45909 Unspecified asthma, uncomplicated: Secondary | ICD-10-CM | POA: Diagnosis not present

## 2011-09-13 DIAGNOSIS — J301 Allergic rhinitis due to pollen: Secondary | ICD-10-CM | POA: Diagnosis not present

## 2011-09-13 DIAGNOSIS — J3089 Other allergic rhinitis: Secondary | ICD-10-CM | POA: Diagnosis not present

## 2011-09-13 DIAGNOSIS — B999 Unspecified infectious disease: Secondary | ICD-10-CM | POA: Diagnosis not present

## 2011-10-07 ENCOUNTER — Other Ambulatory Visit: Payer: Self-pay | Admitting: Internal Medicine

## 2011-10-11 DIAGNOSIS — Z23 Encounter for immunization: Secondary | ICD-10-CM | POA: Diagnosis not present

## 2011-10-19 ENCOUNTER — Other Ambulatory Visit: Payer: Medicare Other | Admitting: Internal Medicine

## 2011-10-19 DIAGNOSIS — E785 Hyperlipidemia, unspecified: Secondary | ICD-10-CM | POA: Diagnosis not present

## 2011-10-19 DIAGNOSIS — N400 Enlarged prostate without lower urinary tract symptoms: Secondary | ICD-10-CM | POA: Diagnosis not present

## 2011-10-19 DIAGNOSIS — Z79899 Other long term (current) drug therapy: Secondary | ICD-10-CM

## 2011-10-19 DIAGNOSIS — Z125 Encounter for screening for malignant neoplasm of prostate: Secondary | ICD-10-CM

## 2011-10-19 LAB — CBC WITH DIFFERENTIAL/PLATELET
Basophils Absolute: 0 10*3/uL (ref 0.0–0.1)
Basophils Relative: 0 % (ref 0–1)
HCT: 45.1 % (ref 39.0–52.0)
MCHC: 33 g/dL (ref 30.0–36.0)
Monocytes Absolute: 0.5 10*3/uL (ref 0.1–1.0)
Neutro Abs: 3.8 10*3/uL (ref 1.7–7.7)
Neutrophils Relative %: 49 % (ref 43–77)
Platelets: 161 10*3/uL (ref 150–400)
RDW: 12.6 % (ref 11.5–15.5)

## 2011-10-19 LAB — COMPREHENSIVE METABOLIC PANEL
AST: 20 U/L (ref 0–37)
Albumin: 3.6 g/dL (ref 3.5–5.2)
Alkaline Phosphatase: 95 U/L (ref 39–117)
BUN: 12 mg/dL (ref 6–23)
Potassium: 4.2 mEq/L (ref 3.5–5.3)

## 2011-10-19 LAB — LIPID PANEL
Cholesterol: 156 mg/dL (ref 0–200)
HDL: 46 mg/dL
LDL Cholesterol: 89 mg/dL (ref 0–99)
Total CHOL/HDL Ratio: 3.4 ratio
Triglycerides: 103 mg/dL
VLDL: 21 mg/dL (ref 0–40)

## 2011-10-19 LAB — PSA, MEDICARE: PSA: 0.33 ng/mL

## 2011-10-21 ENCOUNTER — Ambulatory Visit (INDEPENDENT_AMBULATORY_CARE_PROVIDER_SITE_OTHER): Payer: Medicare Other | Admitting: Internal Medicine

## 2011-10-21 ENCOUNTER — Encounter: Payer: Self-pay | Admitting: Internal Medicine

## 2011-10-21 VITALS — BP 110/60 | HR 72 | Temp 97.5°F | Resp 12 | Ht 72.25 in | Wt 213.0 lb

## 2011-10-21 DIAGNOSIS — M542 Cervicalgia: Secondary | ICD-10-CM | POA: Diagnosis not present

## 2011-10-21 DIAGNOSIS — J309 Allergic rhinitis, unspecified: Secondary | ICD-10-CM | POA: Diagnosis not present

## 2011-10-21 DIAGNOSIS — E785 Hyperlipidemia, unspecified: Secondary | ICD-10-CM | POA: Diagnosis not present

## 2011-10-21 DIAGNOSIS — G4733 Obstructive sleep apnea (adult) (pediatric): Secondary | ICD-10-CM

## 2011-10-21 DIAGNOSIS — Z0000 Encounter for general adult medical examination without abnormal findings: Secondary | ICD-10-CM | POA: Diagnosis not present

## 2011-10-21 LAB — POCT URINALYSIS DIPSTICK
Bilirubin, UA: NEGATIVE
Ketones, UA: NEGATIVE
Leukocytes, UA: NEGATIVE
Spec Grav, UA: 1.025

## 2011-10-21 NOTE — Progress Notes (Signed)
Subjective:    Patient ID: Jeffrey Wu., male    DOB: 10-29-1945, 66 y.o.   MRN: [id number]  HPI66 year old retired [place] male formerly from [place] presents for PE and evaluation of medical problems.  History of hyperlipidemia, allergic rhinitis and sleep apnea. History of anxiety depression. History of BPH.  Social history: He is married. 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 Patient quit smoking some 3 or 4 years ago. Formerly smoked a pack of cigarettes daily for 46 years. Consumes alcohol daily 3 ounces.  Family history: Father died at age 47 of leukemia. Patient was 66 years old at that time. Mother died at age 56 of a stroke after heart surgery. One brother and one sister in good health. 3 adult children 2 sons and a daughter all of whom are in good health. Appendectomy 1955, thyroglossal duct cyst removed in 1990, hernia repair 2009.  Has had recent influenza immunization at pharmacy  Has grommet to be in a year. Awakened this morning with neck pain.    Review of Systems  Constitutional: Negative.  Negative for fever, chills and diaphoresis.  HENT: Positive for congestion, rhinorrhea, sneezing, neck stiffness and postnasal drip.   Eyes: Negative.   Respiratory:       History of sleep apnea has CPAP. Hx allergic rhinitis not on allergy injections. Had grommet tube placed spring 2013 by dr. Christain Sacramento  Cardiovascular: Negative for chest pain, palpitations and leg swelling.  Gastrointestinal:       No constipation  Genitourinary: Negative.   Musculoskeletal:       Occasional back pain in am  Neurological:       Sinus headaches  Hematological: Negative.   Psychiatric/Behavioral:       History of depression       Objective:   Physical Exam  Vitals reviewed. Constitutional: He is oriented to person, place, and time. He appears well-developed and well-nourished.  HENT:  Head: Normocephalic and atraumatic.  Right Ear:  External ear normal.  Left Ear: External ear normal.  Mouth/Throat: Oropharynx is clear and moist.       Blue grommet tube left ear. Small amount wax in right ear.Nasally congested  Eyes: Conjunctivae normal and EOM are normal. Pupils are equal, round, and reactive to light. Left eye exhibits no discharge. No scleral icterus.  Neck: Neck supple. No JVD present. No thyromegaly present.  Cardiovascular: Normal rate, regular rhythm and normal heart sounds.   Pulmonary/Chest: Effort normal and breath sounds normal. He has no wheezes. He has no rales.  Abdominal: Soft. Bowel sounds are normal. He exhibits no distension and no mass. There is no tenderness. There is no rebound and no guarding.  Genitourinary: Prostate normal.  Musculoskeletal: Normal range of motion. He exhibits no edema.  Lymphadenopathy:    He has no cervical adenopathy.  Neurological: He is alert and oriented to person, place, and time. He has normal reflexes. No cranial nerve deficit. Coordination normal.  Skin: Skin is warm and dry.  Psychiatric: He has a normal mood and affect. His behavior is normal. Thought content normal.          Assessment & Plan:  History of anxiety depression  History of sleep apnea  History of BPH  History of smoking but quit 3-4 years ago.  Acute left neck spasm  Plan: Return in 6 months to one year for office visit . Take Flexeril for neck spasm as prescribed.  Apply ice for 20 minutes twice daily to neck. Call if not better in 2-3 days or sooner if worse.  Subjective:   Patient presents for Medicare Annual/Subsequent preventive examination.  Review Past Medical/Family/Social:  See above   Risk Factors  Current exercise habits: walk daily about an hour  Dietary issues discussed: on weight watchers and has lost about 10 pounds.  Cardiac risk factors:hyperlipidemia  Depression Screen  (Note: if answer to either of the following is "Yes", a more complete depression screening is  indicated)   Over the past two weeks, have you felt down, depressed or hopeless? No  Over the past two weeks, have you felt little interest or pleasure in doing things? No Have you lost interest or pleasure in daily life? No Do you often feel hopeless? No Do you cry easily over simple problems? No   Activities of Daily Living  In your present state of health, do you have any difficulty performing the following activities?:   Driving? No  Managing money? No  Feeding yourself? No  Getting from bed to chair? No  Climbing a flight of stairs? No  Preparing food and eating?: No  Bathing or showering? No  Getting dressed: No  Getting to the toilet? No  Using the toilet:No  Moving around from place to place: No  In the past year have you fallen or had a near fall?:No  Are you sexually active? No  Do you have more than one partner? No   Hearing Difficulties: No  Do you often ask people to speak up or repeat themselves? No  Do you experience ringing or noises in your ears? No  Do you have difficulty understanding soft or whispered voices? No  Do you feel that you have a problem with memory? No Do you often misplace items? No    Home Safety:  Do you have a smoke alarm at your residence? Yes Do you have grab bars in the bathroom?yes Do you have throw rugs in your house? yes   Cognitive Testing  Alert? Yes Normal Appearance?Yes  Oriented to person? Yes Place? Yes  Time? Yes  Recall of three objects? Yes  Can perform simple calculations? Yes  Displays appropriate judgment?Yes  Can read the correct time from a watch face?Yes   List the Names of Other Physician/Practitioners you currently use:  See referral list for the physicians patient is currently seeing. Dr. Irena Cords- allergist, Dr. Christain [place]: ENT had grommet tube left ear 2013, Dr. Shelle Iron- sleep apnea    Review of Systems:see EPIC   Objective:     General appearance: Appears stated age and mildly obese  Head:  Normocephalic, without obvious abnormality, atraumatic  Eyes: conj clear, EOMi PEERLA  Ears: normal TM's and external ear canals both ears  Nose: Nares normal. Septum midline. Mucosa normal. No drainage or sinus tenderness.  Throat: lips, mucosa, and tongue normal; teeth and gums normal  Neck: no adenopathy, no carotid bruit, no JVD, supple, symmetrical, trachea midline and thyroid not enlarged, symmetric, no tenderness/mass/nodules  No CVA tenderness.  Lungs: clear to auscultation bilaterally  Breasts: normal appearance, no masses or tenderness, Heart: regular rate and rhythm, S1, S2 normal, no murmur, click, rub or gallop  Abdomen: soft, non-tender; bowel sounds normal; no masses, no organomegaly  Musculoskeletal: ROM normal in all joints, no crepitus, no deformity, Normal muscle strengthen. Back  is symmetric, no curvature. Skin: Skin color, texture, turgor normal. No rashes or lesions  Lymph nodes: Cervical, supraclavicular, and axillary  nodes normal.  Neurologic: CN 2 -12 Normal, Normal symmetric reflexes. Normal coordination and gait  Psych: Alert & Oriented x 3, Mood appear stable.    Assessment:    Annual wellness medicare exam   Plan:    During the course of the visit the patient was educated and counseled about appropriate screening and preventive services including:   Annual PSA. Colonoscopy as directed     Patient Instructions (the written plan) was given to the patient.  Medicare Attestation  I have personally reviewed:  The patient's medical and social history  Their use of alcohol, tobacco or illicit drugs  Their current medications and supplements  The patient's functional ability including ADLs,fall risks, home safety risks, cognitive, and hearing and visual impairment  Diet and physical activities  Evidence for depression or mood disorders  The patient's weight, height, BMI, and visual acuity have been recorded in the chart. I have made referrals, counseling,  and provided education to the patient based on review of the above and I have provided the patient with a written personalized care plan for preventive services.

## 2011-10-21 NOTE — Patient Instructions (Addendum)
Take Flexeril 1/2- one tab tid. Apply ice to neck twice daily. Continue same meds and return in 6 months

## 2011-11-17 DIAGNOSIS — J301 Allergic rhinitis due to pollen: Secondary | ICD-10-CM | POA: Diagnosis not present

## 2011-11-17 DIAGNOSIS — B999 Unspecified infectious disease: Secondary | ICD-10-CM | POA: Diagnosis not present

## 2011-11-17 DIAGNOSIS — J45909 Unspecified asthma, uncomplicated: Secondary | ICD-10-CM | POA: Diagnosis not present

## 2011-11-17 DIAGNOSIS — J3089 Other allergic rhinitis: Secondary | ICD-10-CM | POA: Diagnosis not present

## 2011-12-05 ENCOUNTER — Encounter: Payer: Self-pay | Admitting: Internal Medicine

## 2012-01-06 ENCOUNTER — Ambulatory Visit: Payer: Medicare Other | Admitting: Pulmonary Disease

## 2012-01-18 ENCOUNTER — Encounter: Payer: Self-pay | Admitting: Pulmonary Disease

## 2012-01-18 ENCOUNTER — Ambulatory Visit (INDEPENDENT_AMBULATORY_CARE_PROVIDER_SITE_OTHER): Payer: Medicare Other | Admitting: Pulmonary Disease

## 2012-01-18 VITALS — BP 104/56 | HR 61 | Temp 97.6°F | Ht 73.0 in | Wt 205.2 lb

## 2012-01-18 DIAGNOSIS — G4733 Obstructive sleep apnea (adult) (pediatric): Secondary | ICD-10-CM

## 2012-01-18 DIAGNOSIS — D485 Neoplasm of uncertain behavior of skin: Secondary | ICD-10-CM | POA: Diagnosis not present

## 2012-01-18 NOTE — Progress Notes (Signed)
  Subjective:    Patient ID: Jeffrey Wu., male    DOB: [birth year], 67 y.o.   MRN: [id number]  HPI Patient comes in today for his usual followup for management of obstructive sleep apnea.  He is wearing CPAP compliantly, and is having no issues with pressure, but is having mask leaks.  He has to use a fullface mask, and is having air blowing into his eyes at night while wearing the mask.  Despite this, he feels that he sleeps well with the device, and has excellent daytime alertness.  Of note, his weight is down 14 pounds since last visit.   Review of Systems  Constitutional: Negative for fever and unexpected weight change.  HENT: Positive for congestion, rhinorrhea, postnasal drip and sinus pressure. Negative for ear pain, nosebleeds, sore throat, sneezing, trouble swallowing and dental problem.   Eyes: Negative for redness and itching.  Respiratory: Positive for cough. Negative for chest tightness, shortness of breath and wheezing.   Cardiovascular: Negative for palpitations and leg swelling.  Gastrointestinal: Negative for nausea and vomiting.  Genitourinary: Negative for dysuria.  Musculoskeletal: Negative for joint swelling.  Skin: Positive for rash ( eczema).  Neurological: Positive for headaches.  Hematological: Does not bruise/bleed easily.  Psychiatric/Behavioral: Negative for dysphoric mood. The patient is not nervous/anxious.        Objective:   Physical Exam Well-developed male in no acute distress Nose without purulence or discharge noted No skin breakdown or pressure necrosis from the CPAP mask Neck without lymphadenopathy or thyromegaly Lower extremities without edema, no cyanosis Alert, does not appear to be sleepy, moves all 4 extremities.       Assessment & Plan:

## 2012-01-18 NOTE — Patient Instructions (Addendum)
Continue working on weight loss.  You are doing well. Will refer to sleep center for a mask fitting. followup with me in one year if doing well.

## 2012-01-18 NOTE — Assessment & Plan Note (Signed)
Patient is doing well with CPAP, but we do need to work on a better fitting mask.  I will refer him to the sleep center for a formal mask fitting, and I have asked him to continue working on weight loss.  If he is doing well, we'll see him back in one year.

## 2012-01-19 ENCOUNTER — Ambulatory Visit (HOSPITAL_BASED_OUTPATIENT_CLINIC_OR_DEPARTMENT_OTHER): Payer: Medicare Other | Attending: Pulmonary Disease

## 2012-01-19 DIAGNOSIS — G4733 Obstructive sleep apnea (adult) (pediatric): Secondary | ICD-10-CM

## 2012-02-11 DIAGNOSIS — J301 Allergic rhinitis due to pollen: Secondary | ICD-10-CM | POA: Diagnosis not present

## 2012-02-11 DIAGNOSIS — J3089 Other allergic rhinitis: Secondary | ICD-10-CM | POA: Diagnosis not present

## 2012-02-11 DIAGNOSIS — J019 Acute sinusitis, unspecified: Secondary | ICD-10-CM | POA: Diagnosis not present

## 2012-02-11 DIAGNOSIS — J45909 Unspecified asthma, uncomplicated: Secondary | ICD-10-CM | POA: Diagnosis not present

## 2012-03-13 ENCOUNTER — Other Ambulatory Visit: Payer: Self-pay

## 2012-03-13 MED ORDER — ROSUVASTATIN CALCIUM 10 MG PO TABS: 10.0000 mg | ORAL_TABLET | Freq: Every day | ORAL | Status: DC

## 2012-03-13 MED ORDER — DUTASTERIDE-TAMSULOSIN HCL 0.5-0.4 MG PO CAPS: 1.0000 | ORAL_CAPSULE | Freq: Every day | ORAL | Status: DC

## 2012-04-06 ENCOUNTER — Other Ambulatory Visit: Payer: Self-pay

## 2012-04-16 DIAGNOSIS — R079 Chest pain, unspecified: Secondary | ICD-10-CM | POA: Diagnosis not present

## 2012-04-16 DIAGNOSIS — Z87891 Personal history of nicotine dependence: Secondary | ICD-10-CM | POA: Diagnosis not present

## 2012-04-16 DIAGNOSIS — Z9089 Acquired absence of other organs: Secondary | ICD-10-CM | POA: Diagnosis not present

## 2012-04-16 DIAGNOSIS — J9819 Other pulmonary collapse: Secondary | ICD-10-CM | POA: Diagnosis not present

## 2012-04-16 DIAGNOSIS — J479 Bronchiectasis, uncomplicated: Secondary | ICD-10-CM | POA: Diagnosis not present

## 2012-04-16 DIAGNOSIS — R0602 Shortness of breath: Secondary | ICD-10-CM | POA: Diagnosis not present

## 2012-04-16 DIAGNOSIS — R918 Other nonspecific abnormal finding of lung field: Secondary | ICD-10-CM | POA: Diagnosis not present

## 2012-04-16 DIAGNOSIS — R0902 Hypoxemia: Secondary | ICD-10-CM | POA: Diagnosis not present

## 2012-05-10 ENCOUNTER — Other Ambulatory Visit: Payer: Self-pay

## 2012-05-10 MED ORDER — ROSUVASTATIN CALCIUM 10 MG PO TABS: 10.0000 mg | ORAL_TABLET | Freq: Every day | ORAL | Status: DC

## 2012-05-10 MED ORDER — DUTASTERIDE-TAMSULOSIN HCL 0.5-0.4 MG PO CAPS: 1.0000 | ORAL_CAPSULE | Freq: Every day | ORAL | Status: DC

## 2012-05-19 ENCOUNTER — Other Ambulatory Visit: Payer: Medicare Other | Admitting: Internal Medicine

## 2012-05-19 DIAGNOSIS — Z79899 Other long term (current) drug therapy: Secondary | ICD-10-CM

## 2012-05-19 DIAGNOSIS — E785 Hyperlipidemia, unspecified: Secondary | ICD-10-CM | POA: Diagnosis not present

## 2012-05-19 LAB — HEPATIC FUNCTION PANEL
ALT: 33 U/L (ref 0–53)
AST: 31 U/L (ref 0–37)
Albumin: 4 g/dL (ref 3.5–5.2)
Total Protein: 6.2 g/dL (ref 6.0–8.3)

## 2012-05-19 LAB — LIPID PANEL
Cholesterol: 147 mg/dL (ref 0–200)
HDL: 52 mg/dL (ref >39)
Total CHOL/HDL Ratio: 2.8 Ratio
Triglycerides: 102 mg/dL (ref <150)

## 2012-05-22 ENCOUNTER — Encounter: Payer: Self-pay | Admitting: Internal Medicine

## 2012-05-22 ENCOUNTER — Ambulatory Visit (INDEPENDENT_AMBULATORY_CARE_PROVIDER_SITE_OTHER): Payer: Medicare Other | Admitting: Internal Medicine

## 2012-05-22 VITALS — BP 110/66 | HR 64 | Wt 190.0 lb

## 2012-05-22 DIAGNOSIS — F3289 Other specified depressive episodes: Secondary | ICD-10-CM

## 2012-05-22 DIAGNOSIS — J309 Allergic rhinitis, unspecified: Secondary | ICD-10-CM

## 2012-05-22 DIAGNOSIS — F32A Depression, unspecified: Secondary | ICD-10-CM

## 2012-05-22 DIAGNOSIS — E785 Hyperlipidemia, unspecified: Secondary | ICD-10-CM | POA: Diagnosis not present

## 2012-05-22 DIAGNOSIS — N400 Enlarged prostate without lower urinary tract symptoms: Secondary | ICD-10-CM | POA: Diagnosis not present

## 2012-05-22 DIAGNOSIS — F329 Major depressive disorder, single episode, unspecified: Secondary | ICD-10-CM

## 2012-05-22 DIAGNOSIS — J329 Chronic sinusitis, unspecified: Secondary | ICD-10-CM

## 2012-05-22 DIAGNOSIS — G4733 Obstructive sleep apnea (adult) (pediatric): Secondary | ICD-10-CM

## 2012-05-22 MED ORDER — ROSUVASTATIN CALCIUM 10 MG PO TABS: 10.0000 mg | ORAL_TABLET | Freq: Every day | ORAL | Status: DC

## 2012-05-22 MED ORDER — PAROXETINE HCL 10 MG PO TABS: ORAL_TABLET | ORAL | Status: DC

## 2012-05-22 NOTE — Patient Instructions (Addendum)
Continue same medications and return in 6 months for physical exam. Paxil and Crestor refilled for one year today. Please tell allergist about  reaction to ibuprofen

## 2012-05-22 NOTE — Progress Notes (Signed)
  Subjective:    Patient ID: Jeffrey Wu., male    DOB: December 22, 1945, 67 y.o.   MRN: [id number]  HPI 67 year old white male with history of depression, hyperlipidemia, allergic rhinitis and frequent sinusitis in today for six-month recheck. Has appointment soon to see allergist. He has sleep apnea treated by Dr. Shelle Iron. While on a trip to [facility], he apparently had an allergic reaction with anaphylaxis to ibuprofen. He was treated in the emergency department and improved. He should discuss this further with his allergist. Told him he may not be able to take aspirin in the future and he may need to travel with an EpiPen.  Lipid panel liver functions are within normal limits on Crestor. Paxil was refilled for one year as was Crestor. He has BPH and takes Flomax. Also takes multiple other allergy medications including Astepro, Astelin nasal spray, generic Allegra, Singulair, Dulera.  Also asking about possibility of getting blepharoplasty. Having trouble with eyelid sagging in terms of reading. Recommended plastic surgeon for him.    Review of Systems     Objective:   Physical Exam Chest clear to auscultation. Cardiac exam regular rate and rhythm normal S1 and S2. Extremities without edema. Sounds nasally congested. Skin is warm and dry. Neck is supple without JVD thyromegaly or carotid bruits.        Assessment & Plan:  Chronic sinusitis-to be scheduled with Dr. Madie Reno  Allergic rhinitis-treated by Dr. Madie Reno  Hyperlipidemia lipid panel and liver functions normal on Crestor  History of depression-stable on Paxil  BPH-stable on Flomax  Anaphylaxis to ibuprofen-to be as discussed with Dr. Madie Reno an upcoming appointment  History of sleep apnea  Sagging eyelids-refer to plastic surgeon  Plan: Have refilled for one year Crestor and Paxil. He will discuss chronic sinusitis with allergist as well is recent reaction to ibuprofen. Recommended plastic surgeon for  blepharoplasty. Return in 6 months for physical examination. Flu shot after October 04, 2012.  Time spent with patient reviewing these issues 25 minutes

## 2012-05-22 NOTE — Progress Notes (Deleted)
  Subjective:    Patient ID: Jeffrey Mealy., male    DOB: 05-14-1945, 67 y.o.   MRN: [id number]  HPI  For 6 month recheck. Sees allergist for frequent sinus infections. To see allergist later this month. History of depression and hyperlipidemia.    Review of Systems     Objective:   Physical Exam        Assessment & Plan:

## 2012-05-24 DIAGNOSIS — Z91018 Allergy to other foods: Secondary | ICD-10-CM | POA: Diagnosis not present

## 2012-05-24 DIAGNOSIS — J301 Allergic rhinitis due to pollen: Secondary | ICD-10-CM | POA: Diagnosis not present

## 2012-05-24 DIAGNOSIS — J3089 Other allergic rhinitis: Secondary | ICD-10-CM | POA: Diagnosis not present

## 2012-05-24 DIAGNOSIS — D721 Eosinophilia, unspecified: Secondary | ICD-10-CM | POA: Diagnosis not present

## 2012-05-24 DIAGNOSIS — B999 Unspecified infectious disease: Secondary | ICD-10-CM | POA: Diagnosis not present

## 2012-05-24 DIAGNOSIS — J45909 Unspecified asthma, uncomplicated: Secondary | ICD-10-CM | POA: Diagnosis not present

## 2012-07-18 DIAGNOSIS — H43819 Vitreous degeneration, unspecified eye: Secondary | ICD-10-CM | POA: Diagnosis not present

## 2012-07-27 ENCOUNTER — Telehealth: Payer: Self-pay | Admitting: Pulmonary Disease

## 2012-07-27 NOTE — Telephone Encounter (Signed)
Spoke to pt. He states that he feels like his CPAP machine is not working properly. Reports that his humidifer is set on 79 degrees but when he gets up in the morning the water is not warm and he feels "very dry."  He has taken the machine to [facility] several times to get their help and he feels as if they are blowing him off.  I am going to send a message to [facility] to see if she can work on this. I will also forward this message to [facility] so that he will be aware what is going on.

## 2012-08-01 NOTE — Telephone Encounter (Signed)
Spoke with patient, she states [name] has spoken w patient and arranged a process for patient to get Cpap fixed  ATC patient to verify this has been done and ensure no other concerns no answer, [email or web address]

## 2012-08-01 NOTE — Telephone Encounter (Signed)
ATC Melissa to find out more about whats going on, no answer [facility]

## 2012-08-02 NOTE — Telephone Encounter (Signed)
Called, spoke with pt.  Reports he has spoken with [name] who has provided him information on different settings to try.  This advice has helped.  Pt states nothing further is needed at this time.  He will call back if he experiences any further cpap problems.

## 2012-08-10 DIAGNOSIS — H43399 Other vitreous opacities, unspecified eye: Secondary | ICD-10-CM | POA: Diagnosis not present

## 2012-08-10 DIAGNOSIS — H3560 Retinal hemorrhage, unspecified eye: Secondary | ICD-10-CM | POA: Diagnosis not present

## 2012-08-14 DIAGNOSIS — H2510 Age-related nuclear cataract, unspecified eye: Secondary | ICD-10-CM | POA: Diagnosis not present

## 2012-08-14 DIAGNOSIS — H43819 Vitreous degeneration, unspecified eye: Secondary | ICD-10-CM | POA: Diagnosis not present

## 2012-08-14 DIAGNOSIS — H43399 Other vitreous opacities, unspecified eye: Secondary | ICD-10-CM | POA: Diagnosis not present

## 2012-08-28 DIAGNOSIS — H2510 Age-related nuclear cataract, unspecified eye: Secondary | ICD-10-CM | POA: Diagnosis not present

## 2012-08-28 DIAGNOSIS — H43819 Vitreous degeneration, unspecified eye: Secondary | ICD-10-CM | POA: Diagnosis not present

## 2012-08-28 DIAGNOSIS — H43399 Other vitreous opacities, unspecified eye: Secondary | ICD-10-CM | POA: Diagnosis not present

## 2012-09-14 DIAGNOSIS — J301 Allergic rhinitis due to pollen: Secondary | ICD-10-CM | POA: Diagnosis not present

## 2012-09-14 DIAGNOSIS — J3089 Other allergic rhinitis: Secondary | ICD-10-CM | POA: Diagnosis not present

## 2012-09-14 DIAGNOSIS — J339 Nasal polyp, unspecified: Secondary | ICD-10-CM | POA: Diagnosis not present

## 2012-09-14 DIAGNOSIS — J45909 Unspecified asthma, uncomplicated: Secondary | ICD-10-CM | POA: Diagnosis not present

## 2012-10-30 DIAGNOSIS — J45909 Unspecified asthma, uncomplicated: Secondary | ICD-10-CM | POA: Diagnosis not present

## 2012-10-30 DIAGNOSIS — J329 Chronic sinusitis, unspecified: Secondary | ICD-10-CM | POA: Diagnosis not present

## 2012-10-30 DIAGNOSIS — J339 Nasal polyp, unspecified: Secondary | ICD-10-CM | POA: Diagnosis not present

## 2012-10-30 DIAGNOSIS — J309 Allergic rhinitis, unspecified: Secondary | ICD-10-CM | POA: Diagnosis not present

## 2012-11-01 DIAGNOSIS — Z23 Encounter for immunization: Secondary | ICD-10-CM | POA: Diagnosis not present

## 2012-11-01 DIAGNOSIS — J3489 Other specified disorders of nose and nasal sinuses: Secondary | ICD-10-CM | POA: Diagnosis not present

## 2012-11-02 ENCOUNTER — Telehealth: Payer: Self-pay | Admitting: Internal Medicine

## 2012-11-02 MED ORDER — DUTASTERIDE-TAMSULOSIN HCL 0.5-0.4 MG PO CAPS: 1.0000 | ORAL_CAPSULE | Freq: Every day | ORAL | Status: DC

## 2012-11-02 NOTE — Telephone Encounter (Signed)
Refill Jalyn to [facility] x 6 months

## 2012-11-03 DIAGNOSIS — H43819 Vitreous degeneration, unspecified eye: Secondary | ICD-10-CM | POA: Diagnosis not present

## 2012-11-03 DIAGNOSIS — H2510 Age-related nuclear cataract, unspecified eye: Secondary | ICD-10-CM | POA: Diagnosis not present

## 2012-11-23 ENCOUNTER — Other Ambulatory Visit: Payer: Medicare Other | Admitting: Internal Medicine

## 2012-11-23 ENCOUNTER — Other Ambulatory Visit: Payer: Self-pay | Admitting: *Deleted

## 2012-11-23 DIAGNOSIS — E785 Hyperlipidemia, unspecified: Secondary | ICD-10-CM

## 2012-11-23 DIAGNOSIS — Z130 Encounter for screening for diseases of the blood and blood-forming organs and certain disorders involving the immune mechanism: Secondary | ICD-10-CM

## 2012-11-23 DIAGNOSIS — N400 Enlarged prostate without lower urinary tract symptoms: Secondary | ICD-10-CM

## 2012-11-23 LAB — CBC WITH DIFFERENTIAL/PLATELET
Basophils Absolute: 0 10*3/uL (ref 0.0–0.1)
Basophils Relative: 1 % (ref 0–1)
Eosinophils Absolute: 0.7 10*3/uL (ref 0.0–0.7)
HCT: 44.7 % (ref 39.0–52.0)
Hemoglobin: 15.2 g/dL (ref 13.0–17.0)
Lymphocytes Relative: 30 % (ref 12–46)
Lymphs Abs: 2 10*3/uL (ref 0.7–4.0)
MCH: 31.7 pg (ref 26.0–34.0)
MCHC: 34 g/dL (ref 30.0–36.0)
Monocytes Absolute: 0.5 10*3/uL (ref 0.1–1.0)
Monocytes Relative: 7 % (ref 3–12)
Neutro Abs: 3.5 10*3/uL (ref 1.7–7.7)
Neutrophils Relative %: 52 % (ref 43–77)
Platelets: 171 10*3/uL (ref 150–400)
RDW: 13.6 % (ref 11.5–15.5)
WBC: 6.6 10*3/uL (ref 4.0–10.5)

## 2012-11-23 LAB — COMPREHENSIVE METABOLIC PANEL
AST: 34 U/L (ref 0–37)
Albumin: 3.8 g/dL (ref 3.5–5.2)
Alkaline Phosphatase: 86 U/L (ref 39–117)
BUN: 16 mg/dL (ref 6–23)
Chloride: 101 mEq/L (ref 96–112)
Creat: 1.06 mg/dL (ref 0.50–1.35)
Glucose, Bld: 97 mg/dL (ref 70–99)
Potassium: 4.5 mEq/L (ref 3.5–5.3)
Total Bilirubin: 0.5 mg/dL (ref 0.3–1.2)

## 2012-11-23 LAB — LIPID PANEL
Cholesterol: 158 mg/dL (ref 0–200)
HDL: 57 mg/dL (ref >39)
LDL Cholesterol: 84 mg/dL (ref 0–99)
Triglycerides: 83 mg/dL (ref <150)
VLDL: 17 mg/dL (ref 0–40)

## 2012-11-23 NOTE — Addendum Note (Signed)
Addended by: Myrtie Soman on: 11/23/2012 09:38 AM   Modules accepted: Orders

## 2012-11-24 ENCOUNTER — Ambulatory Visit (INDEPENDENT_AMBULATORY_CARE_PROVIDER_SITE_OTHER): Payer: Medicare Other | Admitting: Internal Medicine

## 2012-11-24 ENCOUNTER — Encounter: Payer: Self-pay | Admitting: Internal Medicine

## 2012-11-24 VITALS — BP 104/68 | HR 60 | Temp 97.3°F | Ht 72.0 in | Wt 187.0 lb

## 2012-11-24 DIAGNOSIS — N400 Enlarged prostate without lower urinary tract symptoms: Secondary | ICD-10-CM

## 2012-11-24 DIAGNOSIS — J309 Allergic rhinitis, unspecified: Secondary | ICD-10-CM

## 2012-11-24 DIAGNOSIS — Z23 Encounter for immunization: Secondary | ICD-10-CM | POA: Diagnosis not present

## 2012-11-24 DIAGNOSIS — Z0000 Encounter for general adult medical examination without abnormal findings: Secondary | ICD-10-CM

## 2012-11-24 DIAGNOSIS — E785 Hyperlipidemia, unspecified: Secondary | ICD-10-CM | POA: Diagnosis not present

## 2012-11-24 DIAGNOSIS — Z8659 Personal history of other mental and behavioral disorders: Secondary | ICD-10-CM | POA: Diagnosis not present

## 2012-11-24 LAB — POCT URINALYSIS DIPSTICK
Bilirubin, UA: NEGATIVE
Blood, UA: NEGATIVE
Glucose, UA: NEGATIVE
Ketones, UA: NEGATIVE
Spec Grav, UA: 1.015
Urobilinogen, UA: 0.2

## 2012-11-24 MED ORDER — ROSUVASTATIN CALCIUM 10 MG PO TABS: 10.0000 mg | ORAL_TABLET | Freq: Every day | ORAL | Status: DC

## 2012-11-24 MED ORDER — DUTASTERIDE-TAMSULOSIN HCL 0.5-0.4 MG PO CAPS: 1.0000 | ORAL_CAPSULE | Freq: Every day | ORAL | Status: DC

## 2012-11-24 MED ORDER — PAROXETINE HCL 10 MG PO TABS: ORAL_TABLET | ORAL | Status: DC

## 2012-11-24 MED ORDER — AZELASTINE HCL 0.1 % NA SOLN: 1.0000 | Freq: Every day | NASAL | Status: DC

## 2012-11-24 NOTE — Progress Notes (Signed)
Subjective:    Patient ID: Jeffrey Wu., male    DOB: [birth year], 67 y.o.   MRN: [id number]  HPI 67 year old male for health maintenance and evaluation of medical problems.  Is to have ibuprofen desensitivity procedure at [facility] in the near future. Recurrent sinusitis and allergic rhinitis. Hx BPH, hyperlipidemia. Hx sleep apnea and uses C-Pap machine.  Past medical history: History of anxiety depression. Appendectomy 1955. Thyroglossal duct cyst removed in 1990. Hernia repair 2009.  Social history: He is married. 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 Quit smoking several years ago and formerly smoked a pack of cigarettes daily for 46 years. Consumes 3 ounces of alcohol daily  Family history: Father died at age 94 of leukemia. Patient was 67 years old at the time. Mother died at age 11 of a stroke after heart surgery. One brother and one sister in good health. 3 adult children-2 sons and a daughter all of whom are in good health.      Review of Systems  Constitutional: Negative.   HENT: Negative.   Eyes:       Droopy eye lids  Respiratory: Negative.   Cardiovascular: Negative.   Gastrointestinal: Negative.   Endocrine:       Hyperlipidemia  Genitourinary:       BPH  Allergic/Immunologic: Positive for environmental allergies.  Hematological: Negative.   Psychiatric/Behavioral: Negative.        Objective:   Physical Exam  Constitutional: He is oriented to person, place, and time. He appears well-developed and well-nourished. No distress.  HENT:  Head: Normocephalic and atraumatic.  Right Ear: External ear normal.  Left Ear: External ear normal.  Mouth/Throat: Oropharynx is clear and moist. No oropharyngeal exudate.  Eyes: Conjunctivae and EOM are normal. Pupils are equal, round, and reactive to light. Right eye exhibits no discharge. Left eye exhibits no discharge. No scleral icterus.  Neck: Neck supple. No JVD  present. No thyromegaly present.  Cardiovascular: Normal rate, regular rhythm and normal heart sounds.   Pulmonary/Chest: Effort normal and breath sounds normal. He has no wheezes.  Abdominal: Soft. Bowel sounds are normal. He exhibits no distension and no mass. There is no tenderness. There is no guarding.  Genitourinary: Prostate normal.  Lymphadenopathy:    He has no cervical adenopathy.  Neurological: He is alert and oriented to person, place, and time. He has normal reflexes. No cranial nerve deficit. Coordination normal.  Skin: Skin is warm and dry. He is not diaphoretic.  Psychiatric: He has a normal mood and affect. His behavior is normal. Judgment and thought content normal.          Assessment & Plan:  Allergic rhinitis  Sleep apnea  History of anxiety depression  Hyperlipidemia  Plan: Is to have desensitization procedure to ibuprofen in the near future. Return in one year or as needed. Refilled Paxil and Crestor.    Subjective:   Patient presents for Medicare Annual/Subsequent preventive examination.   Review Past Medical/Family/Social: see above   Risk Factors  Current exercise habits: walk 2-3 times a week Dietary issues discussed: low fat low carb Weight Watchers diet since August 2013  Cardiac risk factors:  Depression Screen  (Note: if answer to either of the following is "Yes", a more complete depression screening is indicated)   Over the past two weeks, have you felt down, depressed or hopeless? No  Over the past two weeks, have you felt  little interest or pleasure in doing things? No Have you lost interest or pleasure in daily life? No Do you often feel hopeless? No Do you cry easily over simple problems? No   Activities of Daily Living  In your present state of health, do you have any difficulty performing the following activities?:   Driving? No  Managing money? No  Feeding yourself? No  Getting from bed to chair? No  Climbing a flight of  stairs? No  Preparing food and eating?: No  Bathing or showering? No  Getting dressed: No  Getting to the toilet? No  Using the toilet:No  Moving around from place to place: No  In the past year have you fallen or had a near fall?:No  Are you sexually active? No  Do you have more than one partner? No   Hearing Difficulties: No  Do you often ask people to speak up or repeat themselves? No  Do you experience ringing or noises in your ears? No  Do you have difficulty understanding soft or whispered voices? No  Do you feel that you have a problem with memory? No Do you often misplace items? No    Home Safety:  Do you have a smoke alarm at your residence? Yes Do you have grab bars in the bathroom? yes Do you have throw rugs in your house? yes   Cognitive Testing  Alert? Yes Normal Appearance?Yes  Oriented to person? Yes Place? Yes  Time? Yes  Recall of three objects? Yes  Can perform simple calculations? Yes  Displays appropriate judgment?Yes  Can read the correct time from a watch face?Yes   List the Names of Other Physician/Practitioners you currently use:  See referral list for the physicians patient is currently seeing.  Dr. Irena Cords because of allergic rhinitis   Review of Systems: see EPIC   Objective:     General appearance: Appears stated age and mildly obese  Head: Normocephalic, without obvious abnormality, atraumatic  Eyes: conj clear, EOMi PEERLA  Ears: normal TM's and external ear canals both ears  Nose: Nares normal. Septum midline. Mucosa normal. No drainage or sinus tenderness.  Throat: lips, mucosa, and tongue normal; teeth and gums normal  Neck: no adenopathy, no carotid bruit, no JVD, supple, symmetrical, trachea midline and thyroid not enlarged, symmetric, no tenderness/mass/nodules  No CVA tenderness.  Lungs: clear to auscultation bilaterally  Breasts: normal appearance, no masses or tenderness Heart: regular rate and rhythm, S1, S2 normal, no  murmur, click, rub or gallop  Abdomen: soft, non-tender; bowel sounds normal; no masses, no organomegaly  Musculoskeletal: ROM normal in all joints, no crepitus, no deformity, Normal muscle strengthen. Back  is symmetric, no curvature. Skin: Skin color, texture, turgor normal. No rashes or lesions  Lymph nodes: Cervical, supraclavicular, and axillary nodes normal.  Neurologic: CN 2 -12 Normal, Normal symmetric reflexes. Normal coordination and gait  Psych: Alert & Oriented x 3, Mood appear stable.    Assessment:    Annual wellness medicare exam   Plan:    During the course of the visit the patient was educated and counseled about appropriate screening and preventive services including:   Annual PSA      Patient Instructions (the written plan) was given to the patient.  Medicare Attestation  I have personally reviewed:  The patient's medical and social history  Their use of alcohol, tobacco or illicit drugs  Their current medications and supplements  The patient's functional ability including ADLs,fall risks, home safety risks,  cognitive, and hearing and visual impairment  Diet and physical activities  Evidence for depression or mood disorders  The patient's weight, height, BMI, and visual acuity have been recorded in the chart. I have made referrals, counseling, and provided education to the patient based on review of the above and I have provided the patient with a written personalized care plan for preventive services.

## 2012-12-11 ENCOUNTER — Ambulatory Visit (INDEPENDENT_AMBULATORY_CARE_PROVIDER_SITE_OTHER): Payer: Medicare Other | Admitting: Internal Medicine

## 2012-12-11 ENCOUNTER — Encounter: Payer: Self-pay | Admitting: Internal Medicine

## 2012-12-11 VITALS — BP 120/70 | HR 60 | Temp 96.9°F | Ht 72.0 in | Wt 185.0 lb

## 2012-12-11 DIAGNOSIS — G609 Hereditary and idiopathic neuropathy, unspecified: Secondary | ICD-10-CM | POA: Diagnosis not present

## 2012-12-11 DIAGNOSIS — R7309 Other abnormal glucose: Secondary | ICD-10-CM | POA: Diagnosis not present

## 2012-12-12 DIAGNOSIS — J45909 Unspecified asthma, uncomplicated: Secondary | ICD-10-CM | POA: Diagnosis not present

## 2012-12-12 DIAGNOSIS — Z888 Allergy status to other drugs, medicaments and biological substances status: Secondary | ICD-10-CM | POA: Diagnosis not present

## 2012-12-12 DIAGNOSIS — J309 Allergic rhinitis, unspecified: Secondary | ICD-10-CM | POA: Diagnosis not present

## 2012-12-12 LAB — HEMOGLOBIN A1C
Hgb A1c MFr Bld: 6 % — ABNORMAL HIGH (ref <5.7)
Mean Plasma Glucose: 126 mg/dL — ABNORMAL HIGH (ref <117)

## 2012-12-12 LAB — VITAMIN B12: Vitamin B-12: 494 pg/mL (ref 211–911)

## 2012-12-13 DIAGNOSIS — J45909 Unspecified asthma, uncomplicated: Secondary | ICD-10-CM | POA: Diagnosis not present

## 2012-12-13 DIAGNOSIS — Z888 Allergy status to other drugs, medicaments and biological substances status: Secondary | ICD-10-CM | POA: Diagnosis not present

## 2012-12-13 DIAGNOSIS — J309 Allergic rhinitis, unspecified: Secondary | ICD-10-CM | POA: Diagnosis not present

## 2013-01-01 DIAGNOSIS — H6690 Otitis media, unspecified, unspecified ear: Secondary | ICD-10-CM | POA: Diagnosis not present

## 2013-01-01 DIAGNOSIS — J329 Chronic sinusitis, unspecified: Secondary | ICD-10-CM | POA: Diagnosis not present

## 2013-01-10 DIAGNOSIS — J301 Allergic rhinitis due to pollen: Secondary | ICD-10-CM | POA: Diagnosis not present

## 2013-01-10 DIAGNOSIS — J019 Acute sinusitis, unspecified: Secondary | ICD-10-CM | POA: Diagnosis not present

## 2013-01-10 DIAGNOSIS — J3089 Other allergic rhinitis: Secondary | ICD-10-CM | POA: Diagnosis not present

## 2013-01-10 DIAGNOSIS — J339 Nasal polyp, unspecified: Secondary | ICD-10-CM | POA: Diagnosis not present

## 2013-01-17 DIAGNOSIS — M7750 Other enthesopathy of unspecified foot: Secondary | ICD-10-CM | POA: Diagnosis not present

## 2013-01-17 DIAGNOSIS — M2040 Other hammer toe(s) (acquired), unspecified foot: Secondary | ICD-10-CM | POA: Diagnosis not present

## 2013-01-18 ENCOUNTER — Ambulatory Visit (INDEPENDENT_AMBULATORY_CARE_PROVIDER_SITE_OTHER): Payer: Medicare Other | Admitting: Pulmonary Disease

## 2013-01-18 ENCOUNTER — Encounter: Payer: Self-pay | Admitting: Pulmonary Disease

## 2013-01-18 VITALS — BP 102/68 | HR 53 | Temp 97.9°F | Ht 73.0 in | Wt 188.2 lb

## 2013-01-18 DIAGNOSIS — G4733 Obstructive sleep apnea (adult) (pediatric): Secondary | ICD-10-CM | POA: Diagnosis not present

## 2013-01-18 NOTE — Assessment & Plan Note (Signed)
The patient has been doing well with CPAP prior to his recent urine sinus infections. He is also lost significant weight, and this raises the question whether he still has a knot sleep apnea that he still requires CPAP. I have given him the green light to try coming off CPAP for a period of time to see if he remains symptomatic. He will also continue to work on a little more weight loss as well. With respect to his mask leak, I've asked him to replace this cushions and see if this is still an issue.

## 2013-01-18 NOTE — Patient Instructions (Signed)
Continue with cpap for now, but consider coming off for a few weeks once your ear/sinus issues resolve.  If you sleep well and feel rested, can consider coming off cpap.  Just let me know. Get your mask cushion replaced, and if the leaking continues, let me know and we can refer to sleep center for a fitting. followup with me in one year if doing well.

## 2013-01-18 NOTE — Progress Notes (Signed)
   Subjective:    Patient ID: Jeffrey Wu., male    DOB: 10-09-1945, 67 y.o.   MRN: [id number]  HPI Patient comes in today for followup of his obstructive sleep apnea. He is doing fairly well with CPAP, however has had recent issues with either and sinus infections. He is noting increased leaking of this mass, but has not kept up with his new cushions. He has lost significant weight since the last visit, and is really near his ideal body weight.   Review of Systems  Constitutional: Negative for fever and unexpected weight change.  HENT: Positive for congestion, ear pain ( "stuffiness" ear infection), postnasal drip and rhinorrhea. Negative for dental problem, nosebleeds, sinus pressure, sneezing, sore throat and trouble swallowing.   Eyes: Negative for redness and itching.  Respiratory: Negative for cough, chest tightness, shortness of breath and wheezing.   Cardiovascular: Negative for palpitations and leg swelling.  Gastrointestinal: Negative for nausea and vomiting.  Genitourinary: Negative for dysuria.  Musculoskeletal: Negative for joint swelling.  Skin: Negative for rash.  Neurological: Negative for headaches.  Hematological: Does not bruise/bleed easily.  Psychiatric/Behavioral: Negative for dysphoric mood. The patient is not nervous/anxious.        Objective:   Physical Exam Thin male in no acute distress Nose without purulence or discharge noted No skin breakdown or pressure necrosis from the CPAP mask Neck without lymphadenopathy or thyromegaly Lower extremities without edema, no cyanosis Alert and oriented, moves all 4 extremities. Does not appear to be sleepy.       Assessment & Plan:

## 2013-02-08 DIAGNOSIS — H2510 Age-related nuclear cataract, unspecified eye: Secondary | ICD-10-CM | POA: Diagnosis not present

## 2013-02-08 DIAGNOSIS — H43819 Vitreous degeneration, unspecified eye: Secondary | ICD-10-CM | POA: Diagnosis not present

## 2013-02-08 DIAGNOSIS — H43399 Other vitreous opacities, unspecified eye: Secondary | ICD-10-CM | POA: Diagnosis not present

## 2013-02-13 DIAGNOSIS — J45909 Unspecified asthma, uncomplicated: Secondary | ICD-10-CM | POA: Diagnosis not present

## 2013-02-13 DIAGNOSIS — J3089 Other allergic rhinitis: Secondary | ICD-10-CM | POA: Diagnosis not present

## 2013-02-13 DIAGNOSIS — J301 Allergic rhinitis due to pollen: Secondary | ICD-10-CM | POA: Diagnosis not present

## 2013-02-13 DIAGNOSIS — H6690 Otitis media, unspecified, unspecified ear: Secondary | ICD-10-CM | POA: Diagnosis not present

## 2013-02-28 DIAGNOSIS — M7750 Other enthesopathy of unspecified foot: Secondary | ICD-10-CM | POA: Diagnosis not present

## 2013-02-28 DIAGNOSIS — M2040 Other hammer toe(s) (acquired), unspecified foot: Secondary | ICD-10-CM | POA: Diagnosis not present

## 2013-03-04 NOTE — Patient Instructions (Signed)
Continue same meds and return in one year or as needed.

## 2013-03-13 DIAGNOSIS — J309 Allergic rhinitis, unspecified: Secondary | ICD-10-CM | POA: Diagnosis not present

## 2013-03-13 DIAGNOSIS — J45909 Unspecified asthma, uncomplicated: Secondary | ICD-10-CM | POA: Diagnosis not present

## 2013-03-13 DIAGNOSIS — J329 Chronic sinusitis, unspecified: Secondary | ICD-10-CM | POA: Diagnosis not present

## 2013-03-30 DIAGNOSIS — J339 Nasal polyp, unspecified: Secondary | ICD-10-CM | POA: Diagnosis not present

## 2013-03-30 DIAGNOSIS — J45909 Unspecified asthma, uncomplicated: Secondary | ICD-10-CM | POA: Diagnosis not present

## 2013-03-30 DIAGNOSIS — J329 Chronic sinusitis, unspecified: Secondary | ICD-10-CM | POA: Diagnosis not present

## 2013-03-30 DIAGNOSIS — H6690 Otitis media, unspecified, unspecified ear: Secondary | ICD-10-CM | POA: Diagnosis not present

## 2013-04-16 DIAGNOSIS — H6690 Otitis media, unspecified, unspecified ear: Secondary | ICD-10-CM | POA: Diagnosis not present

## 2013-04-16 DIAGNOSIS — J339 Nasal polyp, unspecified: Secondary | ICD-10-CM | POA: Diagnosis not present

## 2013-04-16 DIAGNOSIS — J328 Other chronic sinusitis: Secondary | ICD-10-CM | POA: Diagnosis not present

## 2013-04-16 DIAGNOSIS — J329 Chronic sinusitis, unspecified: Secondary | ICD-10-CM | POA: Diagnosis not present

## 2013-04-16 DIAGNOSIS — J45909 Unspecified asthma, uncomplicated: Secondary | ICD-10-CM | POA: Diagnosis not present

## 2013-04-16 DIAGNOSIS — Z888 Allergy status to other drugs, medicaments and biological substances status: Secondary | ICD-10-CM | POA: Diagnosis not present

## 2013-06-03 DIAGNOSIS — G609 Hereditary and idiopathic neuropathy, unspecified: Secondary | ICD-10-CM | POA: Insufficient documentation

## 2013-06-03 NOTE — Patient Instructions (Signed)
B12 level and hemoglobin A1c to be checked.

## 2013-06-03 NOTE — Progress Notes (Signed)
   Subjective:    Patient ID: Jeffrey Wu., male    DOB: [birth year], 68 y.o.   MRN: [id number]  HPI He was recently here in November for physical examination. Says he forgot to discuss discomfort in legs and feet. He has a history of type 2 diabetes mellitus. Doesn't recall having recent vitamin B12 level drawn. History of  recurrent sinusitis, allergic rhinitis, hyperlipidemia, sleep apnea, BPH, ibuprofen allergy.    Review of Systems     Objective:   Physical Exam Has normal sensation to touch. Deep tendon reflexes 1+ and symmetrical. Pulses are normal in feet. No edema.       Assessment & Plan:  Peripheral neuropathy  Type 2 diabetes mellitus  Plan: Discussed use of gabapentin but he doesn't want to consider that at the present time. Check hemoglobin A 1C and B12 level. Says he can tolerate the discomfort but just wanted to have it checked out.

## 2013-06-04 ENCOUNTER — Other Ambulatory Visit: Payer: Self-pay | Admitting: Internal Medicine

## 2013-06-06 DIAGNOSIS — J45909 Unspecified asthma, uncomplicated: Secondary | ICD-10-CM | POA: Diagnosis not present

## 2013-06-06 DIAGNOSIS — J328 Other chronic sinusitis: Secondary | ICD-10-CM | POA: Diagnosis not present

## 2013-06-06 DIAGNOSIS — J339 Nasal polyp, unspecified: Secondary | ICD-10-CM | POA: Diagnosis not present

## 2013-06-06 DIAGNOSIS — Z888 Allergy status to other drugs, medicaments and biological substances status: Secondary | ICD-10-CM | POA: Diagnosis not present

## 2013-06-11 ENCOUNTER — Other Ambulatory Visit: Payer: Medicare Other | Admitting: Internal Medicine

## 2013-06-11 DIAGNOSIS — Z79899 Other long term (current) drug therapy: Secondary | ICD-10-CM | POA: Diagnosis not present

## 2013-06-11 DIAGNOSIS — E785 Hyperlipidemia, unspecified: Secondary | ICD-10-CM

## 2013-06-11 LAB — HEPATIC FUNCTION PANEL
ALT: 34 U/L (ref 0–53)
AST: 36 U/L (ref 0–37)
Albumin: 3.9 g/dL (ref 3.5–5.2)
Alkaline Phosphatase: 92 U/L (ref 39–117)
BILIRUBIN DIRECT: 0.1 mg/dL (ref 0.0–0.3)
BILIRUBIN INDIRECT: 0.4 mg/dL (ref 0.2–1.2)
BILIRUBIN TOTAL: 0.5 mg/dL (ref 0.2–1.2)
Total Protein: 6.7 g/dL (ref 6.0–8.3)

## 2013-06-11 LAB — LIPID PANEL
Cholesterol: 167 mg/dL (ref 0–200)
HDL: 69 mg/dL (ref >39)
LDL Cholesterol: 86 mg/dL (ref 0–99)
Total CHOL/HDL Ratio: 2.4 Ratio
Triglycerides: 60 mg/dL (ref <150)
VLDL: 12 mg/dL (ref 0–40)

## 2013-06-12 ENCOUNTER — Encounter: Payer: Self-pay | Admitting: Internal Medicine

## 2013-06-12 ENCOUNTER — Ambulatory Visit (INDEPENDENT_AMBULATORY_CARE_PROVIDER_SITE_OTHER): Payer: Medicare Other | Admitting: Internal Medicine

## 2013-06-12 VITALS — BP 104/68 | HR 64 | Ht 72.0 in | Wt 183.0 lb

## 2013-06-12 DIAGNOSIS — E785 Hyperlipidemia, unspecified: Secondary | ICD-10-CM

## 2013-06-12 DIAGNOSIS — J329 Chronic sinusitis, unspecified: Secondary | ICD-10-CM | POA: Diagnosis not present

## 2013-06-12 NOTE — Progress Notes (Signed)
   Subjective:    Patient ID: Jeffrey Wu., male    DOB: [birth year], 68 y.o.   MRN: [id number]  HPI  68 year old male in today to recheck hyperlipidemia. He is on Crestor. Lipid panel liver functions are entirely within normal limits. He feels well with the exception of persistent sinus issues. He says his been advised by physicians at [facility] to have repeat sinus surgery in August. Says that CT obtained showed opaqueness of his sinuses. He was on a trip out [place] and came back clogged up. He underwent aspirin  desensitivity at [facility] but it did not help his sinus situation. He says he has nasal polyps. His depression is stable. He seems in good spirits having come back from  recent trip and plans to go fly fishing with his son in [place] soon before his sinus surgery in August    Review of Systems     Objective:   Physical Exam skin is warm and dry. Nodes none. Neck is supple without JVD thyromegaly or carotid bruits. Chest clear to auscultation. Cardiac exam regular rate and rhythm normal S1 and S2. Extremities without edema. He does sound nasally congested  He has lost 4 more pounds since last visit. Continues on Weight Watchers.        Assessment & Plan:  Hyperlipidemia-stable  Chronic sinusitis-being treated at [facility]: Return in 6 months for physical exam and continue same medications.

## 2013-06-12 NOTE — Patient Instructions (Signed)
Continue same medications and return in 6 months 

## 2013-06-21 ENCOUNTER — Ambulatory Visit (INDEPENDENT_AMBULATORY_CARE_PROVIDER_SITE_OTHER): Payer: Medicare Other | Admitting: Internal Medicine

## 2013-06-21 ENCOUNTER — Encounter: Payer: Self-pay | Admitting: Internal Medicine

## 2013-06-21 VITALS — BP 100/58 | HR 68 | Temp 98.6°F | Wt 183.0 lb

## 2013-06-21 DIAGNOSIS — H65199 Other acute nonsuppurative otitis media, unspecified ear: Secondary | ICD-10-CM

## 2013-06-21 DIAGNOSIS — J328 Other chronic sinusitis: Secondary | ICD-10-CM | POA: Diagnosis not present

## 2013-06-21 DIAGNOSIS — H65192 Other acute nonsuppurative otitis media, left ear: Secondary | ICD-10-CM

## 2013-06-21 DIAGNOSIS — J324 Chronic pansinusitis: Secondary | ICD-10-CM

## 2013-06-21 MED ORDER — PREDNISONE 10 MG PO KIT: PACK | ORAL | Status: DC

## 2013-06-21 MED ORDER — MOXIFLOXACIN HCL 400 MG PO TABS: 400.0000 mg | ORAL_TABLET | Freq: Every day | ORAL | Status: DC

## 2013-06-21 NOTE — Patient Instructions (Signed)
Take Avelox 400 mg daily x 10 days. Take prednisone in tapering course as directed.

## 2013-06-21 NOTE — Progress Notes (Signed)
   Subjective:    Patient ID: Jeffrey Wu., male    DOB: 02-May-1945, 68 y.o.   MRN: [id number]  HPI  History of chronic sinusitis is to have sinus surgery in August at [facility]. Says that he was in [place] several weeks ago and had a bout of left otitis media with severe left ear pain. Now for the past 2 or 3 days has had left ear pain. History of injury to left tympanic membrane in the [employer]. No sore throat or fever.    Review of Systems     Objective:   Physical Exam HEENT exam: Left TM chronically scarred and slightly full but not red. Right TM with some chronic scarring but not red. Sounds nasally congested. Pharynx is clear. Neck supple. Chest clear.       Assessment & Plan:   Left otitis media-acute  Chronic sinusitis  Plan: Avelox 400 mg daily x 10 days. Sterapred DS 10 mg 6 day dosepack. Is leaving to go on a trip soon to [place]. He has refills on Avelox and Sterapred to take with him.

## 2013-07-27 DIAGNOSIS — H2510 Age-related nuclear cataract, unspecified eye: Secondary | ICD-10-CM | POA: Diagnosis not present

## 2013-07-27 DIAGNOSIS — H25019 Cortical age-related cataract, unspecified eye: Secondary | ICD-10-CM | POA: Diagnosis not present

## 2013-07-27 DIAGNOSIS — H43819 Vitreous degeneration, unspecified eye: Secondary | ICD-10-CM | POA: Diagnosis not present

## 2013-08-20 DIAGNOSIS — Z01810 Encounter for preprocedural cardiovascular examination: Secondary | ICD-10-CM | POA: Diagnosis not present

## 2013-08-20 DIAGNOSIS — J45909 Unspecified asthma, uncomplicated: Secondary | ICD-10-CM | POA: Diagnosis not present

## 2013-08-20 DIAGNOSIS — F3289 Other specified depressive episodes: Secondary | ICD-10-CM | POA: Diagnosis not present

## 2013-08-20 DIAGNOSIS — J329 Chronic sinusitis, unspecified: Secondary | ICD-10-CM | POA: Diagnosis not present

## 2013-08-20 DIAGNOSIS — E785 Hyperlipidemia, unspecified: Secondary | ICD-10-CM | POA: Diagnosis not present

## 2013-08-20 DIAGNOSIS — F329 Major depressive disorder, single episode, unspecified: Secondary | ICD-10-CM | POA: Diagnosis not present

## 2013-08-20 DIAGNOSIS — Z886 Allergy status to analgesic agent status: Secondary | ICD-10-CM | POA: Diagnosis not present

## 2013-08-20 DIAGNOSIS — I498 Other specified cardiac arrhythmias: Secondary | ICD-10-CM | POA: Diagnosis not present

## 2013-08-28 DIAGNOSIS — Z87891 Personal history of nicotine dependence: Secondary | ICD-10-CM | POA: Diagnosis not present

## 2013-08-28 DIAGNOSIS — J338 Other polyp of sinus: Secondary | ICD-10-CM | POA: Diagnosis not present

## 2013-08-28 DIAGNOSIS — J329 Chronic sinusitis, unspecified: Secondary | ICD-10-CM | POA: Diagnosis not present

## 2013-08-28 DIAGNOSIS — E785 Hyperlipidemia, unspecified: Secondary | ICD-10-CM | POA: Diagnosis not present

## 2013-08-28 DIAGNOSIS — J322 Chronic ethmoidal sinusitis: Secondary | ICD-10-CM | POA: Diagnosis not present

## 2013-08-28 DIAGNOSIS — J321 Chronic frontal sinusitis: Secondary | ICD-10-CM | POA: Diagnosis not present

## 2013-08-28 DIAGNOSIS — IMO0002 Reserved for concepts with insufficient information to code with codable children: Secondary | ICD-10-CM | POA: Diagnosis not present

## 2013-08-28 DIAGNOSIS — J339 Nasal polyp, unspecified: Secondary | ICD-10-CM | POA: Diagnosis not present

## 2013-08-28 DIAGNOSIS — J320 Chronic maxillary sinusitis: Secondary | ICD-10-CM | POA: Diagnosis not present

## 2013-08-28 DIAGNOSIS — J45909 Unspecified asthma, uncomplicated: Secondary | ICD-10-CM | POA: Diagnosis not present

## 2013-08-28 DIAGNOSIS — F3289 Other specified depressive episodes: Secondary | ICD-10-CM | POA: Diagnosis not present

## 2013-08-28 DIAGNOSIS — Z888 Allergy status to other drugs, medicaments and biological substances status: Secondary | ICD-10-CM | POA: Diagnosis not present

## 2013-08-28 DIAGNOSIS — J323 Chronic sphenoidal sinusitis: Secondary | ICD-10-CM | POA: Diagnosis not present

## 2013-08-28 DIAGNOSIS — F329 Major depressive disorder, single episode, unspecified: Secondary | ICD-10-CM | POA: Diagnosis not present

## 2013-09-03 ENCOUNTER — Ambulatory Visit (INDEPENDENT_AMBULATORY_CARE_PROVIDER_SITE_OTHER): Payer: Medicare Other | Admitting: Internal Medicine

## 2013-09-03 ENCOUNTER — Encounter: Payer: Self-pay | Admitting: Internal Medicine

## 2013-09-03 VITALS — BP 116/68 | HR 60 | Wt 182.0 lb

## 2013-09-03 DIAGNOSIS — M722 Plantar fascial fibromatosis: Secondary | ICD-10-CM | POA: Diagnosis not present

## 2013-09-03 NOTE — Patient Instructions (Signed)
Patient has appointment  to see Dr. Delilah Shan at [facility] orthopedics

## 2013-09-03 NOTE — Progress Notes (Signed)
   Subjective:    Patient ID: Jeffrey Wu., male    DOB: Apr 06, 1945, 68 y.o.   MRN: [id number]  HPI  This pt had some recent Sinus surgery at [facility]. Prior to that he spent a week in [place] hiking and fishing.Just last week noticed small lump/nodule about 1.25 cm in width which is quite tender. He thought it might be a cyst. He though it looked pale like it might be infected. He thought about sticking a needle in it but came here today instead. Doesn't recall any injury or any foreign body that could of gotten into the bottom of his foot.    Review of Systems     Objective:   Physical Exam  Very tender small palpable nodule palpable midfoot plantar aspect      Assessment & Plan:  Nodule on foot mid plantar aspect  ? Plantar fibromatosis    ? Injury,? Foreign body,? Abscess  ? Benign lesion  Plan: Refer to orthopedist for evaluation.

## 2013-09-04 DIAGNOSIS — R229 Localized swelling, mass and lump, unspecified: Secondary | ICD-10-CM | POA: Diagnosis not present

## 2013-09-05 DIAGNOSIS — J328 Other chronic sinusitis: Secondary | ICD-10-CM | POA: Diagnosis not present

## 2013-09-05 DIAGNOSIS — T39095A Adverse effect of salicylates, initial encounter: Secondary | ICD-10-CM | POA: Diagnosis not present

## 2013-09-05 DIAGNOSIS — J45909 Unspecified asthma, uncomplicated: Secondary | ICD-10-CM | POA: Diagnosis not present

## 2013-09-05 DIAGNOSIS — J339 Nasal polyp, unspecified: Secondary | ICD-10-CM | POA: Diagnosis not present

## 2013-09-14 DIAGNOSIS — J45909 Unspecified asthma, uncomplicated: Secondary | ICD-10-CM | POA: Diagnosis not present

## 2013-09-14 DIAGNOSIS — J329 Chronic sinusitis, unspecified: Secondary | ICD-10-CM | POA: Diagnosis not present

## 2013-09-14 DIAGNOSIS — H6690 Otitis media, unspecified, unspecified ear: Secondary | ICD-10-CM | POA: Diagnosis not present

## 2013-09-14 DIAGNOSIS — J339 Nasal polyp, unspecified: Secondary | ICD-10-CM | POA: Diagnosis not present

## 2013-09-14 DIAGNOSIS — T39095A Adverse effect of salicylates, initial encounter: Secondary | ICD-10-CM | POA: Diagnosis not present

## 2013-09-15 ENCOUNTER — Other Ambulatory Visit: Payer: Self-pay | Admitting: Internal Medicine

## 2013-09-21 DIAGNOSIS — M722 Plantar fascial fibromatosis: Secondary | ICD-10-CM | POA: Diagnosis not present

## 2013-09-26 DIAGNOSIS — J708 Respiratory conditions due to other specified external agents: Secondary | ICD-10-CM | POA: Diagnosis not present

## 2013-09-26 DIAGNOSIS — J339 Nasal polyp, unspecified: Secondary | ICD-10-CM | POA: Diagnosis not present

## 2013-09-26 DIAGNOSIS — J45909 Unspecified asthma, uncomplicated: Secondary | ICD-10-CM | POA: Diagnosis not present

## 2013-09-26 DIAGNOSIS — Z888 Allergy status to other drugs, medicaments and biological substances status: Secondary | ICD-10-CM | POA: Diagnosis not present

## 2013-10-15 DIAGNOSIS — Z823 Family history of stroke: Secondary | ICD-10-CM | POA: Diagnosis not present

## 2013-10-15 DIAGNOSIS — Z8249 Family history of ischemic heart disease and other diseases of the circulatory system: Secondary | ICD-10-CM | POA: Diagnosis not present

## 2013-10-15 DIAGNOSIS — J329 Chronic sinusitis, unspecified: Secondary | ICD-10-CM | POA: Diagnosis not present

## 2013-10-15 DIAGNOSIS — J45909 Unspecified asthma, uncomplicated: Secondary | ICD-10-CM | POA: Diagnosis not present

## 2013-10-15 DIAGNOSIS — Z87891 Personal history of nicotine dependence: Secondary | ICD-10-CM | POA: Diagnosis not present

## 2013-10-15 DIAGNOSIS — J339 Nasal polyp, unspecified: Secondary | ICD-10-CM | POA: Diagnosis not present

## 2013-10-15 DIAGNOSIS — J45998 Other asthma: Secondary | ICD-10-CM | POA: Diagnosis not present

## 2013-10-15 DIAGNOSIS — H6692 Otitis media, unspecified, left ear: Secondary | ICD-10-CM | POA: Diagnosis not present

## 2013-10-15 DIAGNOSIS — Z7982 Long term (current) use of aspirin: Secondary | ICD-10-CM | POA: Diagnosis not present

## 2013-10-15 DIAGNOSIS — Z79899 Other long term (current) drug therapy: Secondary | ICD-10-CM | POA: Diagnosis not present

## 2013-10-15 DIAGNOSIS — Z8052 Family history of malignant neoplasm of bladder: Secondary | ICD-10-CM | POA: Diagnosis not present

## 2013-10-15 DIAGNOSIS — Z833 Family history of diabetes mellitus: Secondary | ICD-10-CM | POA: Diagnosis not present

## 2013-10-15 DIAGNOSIS — T39015D Adverse effect of aspirin, subsequent encounter: Secondary | ICD-10-CM | POA: Diagnosis not present

## 2013-10-15 DIAGNOSIS — Z7952 Long term (current) use of systemic steroids: Secondary | ICD-10-CM | POA: Diagnosis not present

## 2013-10-15 DIAGNOSIS — H6522 Chronic serous otitis media, left ear: Secondary | ICD-10-CM | POA: Diagnosis not present

## 2013-10-15 DIAGNOSIS — J3089 Other allergic rhinitis: Secondary | ICD-10-CM | POA: Diagnosis not present

## 2013-10-18 DIAGNOSIS — Z23 Encounter for immunization: Secondary | ICD-10-CM | POA: Diagnosis not present

## 2013-10-22 ENCOUNTER — Encounter: Payer: Self-pay | Admitting: Internal Medicine

## 2013-11-02 ENCOUNTER — Encounter: Payer: Self-pay | Admitting: Internal Medicine

## 2013-11-02 ENCOUNTER — Ambulatory Visit (INDEPENDENT_AMBULATORY_CARE_PROVIDER_SITE_OTHER): Payer: Medicare Other | Admitting: Internal Medicine

## 2013-11-02 VITALS — BP 110/68 | HR 69 | Temp 98.4°F | Wt 184.0 lb

## 2013-11-02 DIAGNOSIS — R197 Diarrhea, unspecified: Secondary | ICD-10-CM | POA: Diagnosis not present

## 2013-11-02 LAB — POCT URINALYSIS DIPSTICK
Bilirubin, UA: NEGATIVE
Blood, UA: NEGATIVE
Glucose, UA: NEGATIVE
Ketones, UA: NEGATIVE
Leukocytes, UA: NEGATIVE
NITRITE UA: NEGATIVE
PH UA: 6
Protein, UA: NEGATIVE
Spec Grav, UA: 1.01
Urobilinogen, UA: NEGATIVE

## 2013-11-03 LAB — CBC WITH DIFFERENTIAL/PLATELET
Basophils Absolute: 0 10*3/uL (ref 0.0–0.1)
Basophils Relative: 0 % (ref 0–1)
EOS ABS: 0.4 10*3/uL (ref 0.0–0.7)
Eosinophils Relative: 4 % (ref 0–5)
HCT: 43.1 % (ref 39.0–52.0)
Hemoglobin: 14.3 g/dL (ref 13.0–17.0)
LYMPHS ABS: 1.6 10*3/uL (ref 0.7–4.0)
Lymphocytes Relative: 18 % (ref 12–46)
MCH: 31.8 pg (ref 26.0–34.0)
MCHC: 33.2 g/dL (ref 30.0–36.0)
MCV: 96 fL (ref 78.0–100.0)
MONOS PCT: 13 % — AB (ref 3–12)
Monocytes Absolute: 1.2 10*3/uL — ABNORMAL HIGH (ref 0.1–1.0)
NEUTROS ABS: 5.8 10*3/uL (ref 1.7–7.7)
NEUTROS PCT: 65 % (ref 43–77)
Platelets: 213 10*3/uL (ref 150–400)
RBC: 4.49 MIL/uL (ref 4.22–5.81)
RDW: 12.7 % (ref 11.5–15.5)
WBC: 8.9 10*3/uL (ref 4.0–10.5)

## 2013-11-30 ENCOUNTER — Other Ambulatory Visit: Payer: Self-pay | Admitting: Internal Medicine

## 2013-12-11 ENCOUNTER — Other Ambulatory Visit: Payer: Medicare Other | Admitting: Internal Medicine

## 2013-12-11 DIAGNOSIS — J988 Other specified respiratory disorders: Secondary | ICD-10-CM | POA: Diagnosis not present

## 2013-12-11 DIAGNOSIS — J4530 Mild persistent asthma, uncomplicated: Secondary | ICD-10-CM | POA: Diagnosis not present

## 2013-12-11 DIAGNOSIS — Z889 Allergy status to unspecified drugs, medicaments and biological substances status: Secondary | ICD-10-CM | POA: Diagnosis not present

## 2013-12-11 DIAGNOSIS — J339 Nasal polyp, unspecified: Secondary | ICD-10-CM | POA: Diagnosis not present

## 2013-12-12 DIAGNOSIS — J4530 Mild persistent asthma, uncomplicated: Secondary | ICD-10-CM | POA: Diagnosis not present

## 2013-12-12 DIAGNOSIS — J339 Nasal polyp, unspecified: Secondary | ICD-10-CM | POA: Diagnosis not present

## 2013-12-12 DIAGNOSIS — J988 Other specified respiratory disorders: Secondary | ICD-10-CM | POA: Diagnosis not present

## 2013-12-13 ENCOUNTER — Ambulatory Visit (INDEPENDENT_AMBULATORY_CARE_PROVIDER_SITE_OTHER): Payer: Medicare Other | Admitting: Internal Medicine

## 2013-12-13 ENCOUNTER — Encounter: Payer: Self-pay | Admitting: Internal Medicine

## 2013-12-13 ENCOUNTER — Other Ambulatory Visit: Payer: Medicare Other | Admitting: Internal Medicine

## 2013-12-13 VITALS — BP 98/62 | HR 64 | Temp 97.8°F | Ht 73.0 in | Wt 186.0 lb

## 2013-12-13 DIAGNOSIS — E785 Hyperlipidemia, unspecified: Secondary | ICD-10-CM

## 2013-12-13 DIAGNOSIS — Z130 Encounter for screening for diseases of the blood and blood-forming organs and certain disorders involving the immune mechanism: Secondary | ICD-10-CM

## 2013-12-13 DIAGNOSIS — Z0000 Encounter for general adult medical examination without abnormal findings: Secondary | ICD-10-CM

## 2013-12-13 DIAGNOSIS — J309 Allergic rhinitis, unspecified: Secondary | ICD-10-CM

## 2013-12-13 DIAGNOSIS — Z23 Encounter for immunization: Secondary | ICD-10-CM

## 2013-12-13 DIAGNOSIS — Z8659 Personal history of other mental and behavioral disorders: Secondary | ICD-10-CM | POA: Diagnosis not present

## 2013-12-13 DIAGNOSIS — Z125 Encounter for screening for malignant neoplasm of prostate: Secondary | ICD-10-CM | POA: Diagnosis not present

## 2013-12-13 DIAGNOSIS — N400 Enlarged prostate without lower urinary tract symptoms: Secondary | ICD-10-CM | POA: Diagnosis not present

## 2013-12-13 LAB — CBC WITH DIFFERENTIAL/PLATELET
BASOS ABS: 0 10*3/uL (ref 0.0–0.1)
BASOS PCT: 0 % (ref 0–1)
Eosinophils Absolute: 0.5 10*3/uL (ref 0.0–0.7)
Eosinophils Relative: 8 % — ABNORMAL HIGH (ref 0–5)
HCT: 45 % (ref 39.0–52.0)
Hemoglobin: 15.2 g/dL (ref 13.0–17.0)
Lymphocytes Relative: 38 % (ref 12–46)
Lymphs Abs: 2.2 10*3/uL (ref 0.7–4.0)
MCH: 31.5 pg (ref 26.0–34.0)
MCHC: 33.8 g/dL (ref 30.0–36.0)
MCV: 93.2 fL (ref 78.0–100.0)
MPV: 10.8 fL (ref 9.4–12.4)
Monocytes Absolute: 0.5 10*3/uL (ref 0.1–1.0)
Monocytes Relative: 9 % (ref 3–12)
NEUTROS PCT: 45 % (ref 43–77)
Neutro Abs: 2.6 10*3/uL (ref 1.7–7.7)
PLATELETS: 183 10*3/uL (ref 150–400)
RBC: 4.83 MIL/uL (ref 4.22–5.81)
RDW: 13.1 % (ref 11.5–15.5)
WBC: 5.8 10*3/uL (ref 4.0–10.5)

## 2013-12-13 LAB — COMPREHENSIVE METABOLIC PANEL
ALBUMIN: 3.9 g/dL (ref 3.5–5.2)
ALT: 19 U/L (ref 0–53)
AST: 21 U/L (ref 0–37)
Alkaline Phosphatase: 77 U/L (ref 39–117)
BUN: 20 mg/dL (ref 6–23)
CO2: 31 mEq/L (ref 19–32)
Calcium: 9.1 mg/dL (ref 8.4–10.5)
Chloride: 101 mEq/L (ref 96–112)
Creat: 1.07 mg/dL (ref 0.50–1.35)
Glucose, Bld: 98 mg/dL (ref 70–99)
Potassium: 4.7 mEq/L (ref 3.5–5.3)
SODIUM: 138 meq/L (ref 135–145)
TOTAL PROTEIN: 6.2 g/dL (ref 6.0–8.3)
Total Bilirubin: 0.5 mg/dL (ref 0.2–1.2)

## 2013-12-13 LAB — LIPID PANEL
Cholesterol: 159 mg/dL (ref 0–200)
HDL: 69 mg/dL (ref >39)
LDL CALC: 76 mg/dL (ref 0–99)
Total CHOL/HDL Ratio: 2.3 Ratio
Triglycerides: 68 mg/dL (ref <150)
VLDL: 14 mg/dL (ref 0–40)

## 2013-12-13 LAB — POCT URINALYSIS DIPSTICK
Bilirubin, UA: NEGATIVE
Blood, UA: NEGATIVE
GLUCOSE UA: NEGATIVE
Ketones, UA: NEGATIVE
Leukocytes, UA: NEGATIVE
NITRITE UA: NEGATIVE
Protein, UA: NEGATIVE
Spec Grav, UA: 1.015
Urobilinogen, UA: NEGATIVE
pH, UA: 6

## 2013-12-13 MED ORDER — PNEUMOCOCCAL 13-VAL CONJ VACC IM SUSP
0.5000 mL | Freq: Once | INTRAMUSCULAR | Status: AC
  Administered 2013-12-13: 0.5 mL via INTRAMUSCULAR

## 2013-12-13 NOTE — Progress Notes (Deleted)
Subjective:    Patient ID: Jeffrey Feathers., male    DOB: [birth year], 68 y.o.   MRN: [id number]  HPI  HPI  68 year old male for health maintenance and evaluation of medical problems.  Has  Had aspirin desensitivity procedure at [facility] . Recurrent sinusitis and allergic rhinitis. Hx BPH, hyperlipidemia. Hx sleep apnea and uses C-Pap machine.  Past medical history: History of anxiety depression. Appendectomy 1955. Thyroglossal duct cyst removed in 1990. Hernia repair 2009.  Social history: He is married. 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 Quit smoking several years ago and formerly smoked a pack of cigarettes daily for 46 years. Consumes 3 ounces of alcohol daily  Family history: Father died at age 16 of leukemia. Patient was 68 years old at the time. Mother died at age 54 of a stroke after heart surgery. One brother and one sister in good health. 3 adult children-2 sons and a daughter all of whom are in good health.      Review of Systems  Constitutional: Negative.  HENT: Negative.  Eyes:   Droopy eye lids  Respiratory: Negative.  Cardiovascular: Negative.  Gastrointestinal: Negative.  Endocrine:   Hyperlipidemia  Genitourinary:   BPH  Allergic/Immunologic: Positive for environmental allergies.  Hematological: Negative.  Psychiatric/Behavioral: Negative.       Objective:   Physical Exam  Constitutional: He is oriented to person, place, and time. He appears well-developed and well-nourished. No distress.  HENT:  Head: Normocephalic and atraumatic.  Right Ear: External ear normal.  Left Ear: External ear normal.  Mouth/Throat: Oropharynx is clear and moist. No oropharyngeal exudate.  Eyes: Conjunctivae and EOM are normal. Pupils are equal, round, and reactive to light. Right eye exhibits no discharge. Left eye exhibits no discharge. No scleral icterus.  Neck: Neck supple. No JVD  present. No thyromegaly present.  Cardiovascular: Normal rate, regular rhythm and normal heart sounds.  Pulmonary/Chest: Effort normal and breath sounds normal. He has no wheezes.  Abdominal: Soft. Bowel sounds are normal. He exhibits no distension and no mass. There is no tenderness. There is no guarding.  Genitourinary: Prostate normal.  Lymphadenopathy:   He has no cervical adenopathy.  Neurological: He is alert and oriented to person, place, and time. He has normal reflexes. No cranial nerve deficit. Coordination normal.  Skin: Skin is warm and dry. He is not diaphoretic.  Psychiatric: He has a normal mood and affect. His behavior is normal. Judgment and thought content normal.          Assessment & Plan:  Allergic rhinitis  Sleep apnea  History of anxiety depression  Hyperlipidemia  Plan:  Return in 6 months. Prevnar given today. Labs pending.    Subjective:   Patient presents for Medicare Annual/Subsequent preventive examination.   Review Past Medical/Family/Social: see above   Risk Factors  Current exercise habits: walk 2-3 times a week Dietary issues discussed: low fat low carb Weight Watchers diet since August 2013  Cardiac risk factors: hyperlipidemia  Depression Screen  (Note: if answer to either of the following is "Yes", a more complete depression screening is indicated)   Over the past two weeks, have you felt down, depressed or hopeless? No  Over the past two weeks, have you felt little interest or pleasure in doing things? No Have you lost interest or pleasure in daily life? No Do you often feel hopeless? No Do you cry easily over simple problems?  No   Activities of Daily Living  In your present state of health, do you have any difficulty performing the following activities?:   Driving? No  Managing money? No  Feeding yourself? No  Getting from bed to chair? No  Climbing a flight of stairs? No  Preparing food and eating?: No   Bathing or showering? No  Getting dressed: No  Getting to the toilet? No  Using the toilet:No  Moving around from place to place: No  In the past year have you fallen or had a near fall?:No  Are you sexually active? No  Do you have more than one partner? No   Hearing Difficulties: No  Do you often ask people to speak up or repeat themselves? No  Do you experience ringing or noises in your ears? No  Do you have difficulty understanding soft or whispered voices? No  Do you feel that you have a problem with memory? No Do you often misplace items? No    Home Safety:  Do you have a smoke alarm at your residence? Yes Do you have grab bars in the bathroom? yes Do you have throw rugs in your house? yes   Cognitive Testing  Alert? Yes Normal Appearance?Yes  Oriented to person? Yes Place? Yes  Time? Yes  Recall of three objects? Yes  Can perform simple calculations? Yes  Displays appropriate judgment?Yes  Can read the correct time from a watch face?Yes   List the Names of Other Physician/Practitioners you currently use:  See referral list for the physicians patient is currently seeing.  Dr. Harold Hedge because of allergic rhinitis   Review of Systems: see EPIC   Objective:     General appearance: Appears stated age and mildly obese  Head: Normocephalic, without obvious abnormality, atraumatic  Eyes: conj clear, EOMi PEERLA  Ears: normal TM's and external ear canals both ears  Nose: Nares normal. Septum midline. Mucosa normal. No drainage or sinus tenderness.  Throat: lips, mucosa, and tongue normal; teeth and gums normal  Neck: no adenopathy, no carotid bruit, no JVD, supple, symmetrical, trachea midline and thyroid not enlarged, symmetric, no tenderness/mass/nodules  No CVA tenderness.  Lungs: clear to auscultation bilaterally  Breasts: normal appearance, no masses or tenderness Heart: regular rate and rhythm, S1, S2 normal, no murmur,  click, rub or gallop  Abdomen: soft, non-tender; bowel sounds normal; no masses, no organomegaly  Musculoskeletal: ROM normal in all joints, no crepitus, no deformity, Normal muscle strengthen. Back is symmetric, no curvature. Skin: Skin color, texture, turgor normal. No rashes or lesions  Lymph nodes: Cervical, supraclavicular, and axillary nodes normal.  Neurologic: CN 2 -12 Normal, Normal symmetric reflexes. Normal coordination and gait  Psych: Alert & Oriented x 3, Mood appear stable.    Assessment:    Annual wellness medicare exam   Plan:    During the course of the visit the patient was educated and counseled about appropriate screening and preventive services including:   Annual PSA      Patient Instructions (the written plan) was given to the patient.  Medicare Attestation  I have personally reviewed:  The patient's medical and social history  Their use of alcohol, tobacco or illicit drugs  Their current medications and supplements  The patient's functional ability including ADLs,fall risks, home safety risks, cognitive, and hearing and visual impairment  Diet and physical activities  Evidence for depression or mood disorders  The patient's weight, height, BMI, and visual acuity have been recorded in the  chart. I have made referrals, counseling, and provided education to the patient based on review of the above and I have provided the patient with a written personalized care plan for preventive services.                     Review of Systems     Objective:   Physical Exam        Assessment & Plan:

## 2013-12-13 NOTE — Progress Notes (Deleted)
HPI  68 year old male for health maintenance and evaluation of medical problems.  Has  Had aspirin desensitivity procedure at [facility] . Recurrent sinusitis and allergic rhinitis. Hx BPH, hyperlipidemia. Hx sleep apnea and uses C-Pap machine.  Past medical history: History of anxiety depression. Appendectomy 1955. Thyroglossal duct cyst removed in 1990. Hernia repair 2009.  Social history: He is married. 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 Quit smoking several years ago and formerly smoked a pack of cigarettes daily for 46 years. Consumes 3 ounces of alcohol daily  Family history: Father died at age 73 of leukemia. Patient was 68 years old at the time. Mother died at age 69 of a stroke after heart surgery. One brother and one sister in good health. 3 adult children-2 sons and a daughter all of whom are in good health.      Review of Systems  Constitutional: Negative.  HENT: Negative.  Eyes:   Droopy eye lids  Respiratory: Negative.  Cardiovascular: Negative.  Gastrointestinal: Negative.  Endocrine:   Hyperlipidemia  Genitourinary:   BPH  Allergic/Immunologic: Positive for environmental allergies.  Hematological: Negative.  Psychiatric/Behavioral: Negative.       Objective:   Physical Exam  Constitutional: He is oriented to person, place, and time. He appears well-developed and well-nourished. No distress.  HENT:  Head: Normocephalic and atraumatic.  Right Ear: External ear normal.  Left Ear: External ear normal.  Mouth/Throat: Oropharynx is clear and moist. No oropharyngeal exudate.  Eyes: Conjunctivae and EOM are normal. Pupils are equal, round, and reactive to light. Right eye exhibits no discharge. Left eye exhibits no discharge. No scleral icterus.  Neck: Neck supple. No JVD present. No thyromegaly present.  Cardiovascular: Normal rate, regular rhythm and normal heart sounds.   Pulmonary/Chest: Effort normal and breath sounds normal. He has no wheezes.  Abdominal: Soft. Bowel sounds are normal. He exhibits no distension and no mass. There is no tenderness. There is no guarding.  Genitourinary: Prostate normal.  Lymphadenopathy:   He has no cervical adenopathy.  Neurological: He is alert and oriented to person, place, and time. He has normal reflexes. No cranial nerve deficit. Coordination normal.  Skin: Skin is warm and dry. He is not diaphoretic.  Psychiatric: He has a normal mood and affect. His behavior is normal. Judgment and thought content normal.          Assessment & Plan:  Allergic rhinitis  Sleep apnea  History of anxiety depression  Hyperlipidemia  Plan:  Return in 6 months. Prevnar given today. Labs pending.    Subjective:   Patient presents for Medicare Annual/Subsequent preventive examination.   Review Past Medical/Family/Social: see above   Risk Factors  Current exercise habits: walk 2-3 times a week Dietary issues discussed: low fat low carb Weight Watchers diet since August 2013  Cardiac risk factors: hyperlipidemia  Depression Screen  (Note: if answer to either of the following is "Yes", a more complete depression screening is indicated)   Over the past two weeks, have you felt down, depressed or hopeless? No  Over the past two weeks, have you felt little interest or pleasure in doing things? No Have you lost interest or pleasure in daily life? No Do you often feel hopeless? No Do you cry easily over simple problems? No   Activities of Daily Living  In your present state of health, do you have any difficulty performing the following activities?:   Driving?  No  Managing money? No  Feeding yourself? No  Getting from bed to chair? No  Climbing a flight of stairs? No  Preparing food and eating?: No  Bathing or showering? No  Getting dressed: No  Getting to the toilet? No  Using the toilet:No   Moving around from place to place: No  In the past year have you fallen or had a near fall?:No  Are you sexually active? No  Do you have more than one partner? No   Hearing Difficulties: No  Do you often ask people to speak up or repeat themselves? No  Do you experience ringing or noises in your ears? No  Do you have difficulty understanding soft or whispered voices? No  Do you feel that you have a problem with memory? No Do you often misplace items? No    Home Safety:  Do you have a smoke alarm at your residence? Yes Do you have grab bars in the bathroom? yes Do you have throw rugs in your house? yes   Cognitive Testing  Alert? Yes Normal Appearance?Yes  Oriented to person? Yes Place? Yes  Time? Yes  Recall of three objects? Yes  Can perform simple calculations? Yes  Displays appropriate judgment?Yes  Can read the correct time from a watch face?Yes   List the Names of Other Physician/Practitioners you currently use:  See referral list for the physicians patient is currently seeing.  Dr. Harold Hedge because of allergic rhinitis   Review of Systems: see EPIC   Objective:     General appearance: Appears stated age and mildly obese  Head: Normocephalic, without obvious abnormality, atraumatic  Eyes: conj clear, EOMi PEERLA  Ears: normal TM's and external ear canals both ears  Nose: Nares normal. Septum midline. Mucosa normal. No drainage or sinus tenderness.  Throat: lips, mucosa, and tongue normal; teeth and gums normal  Neck: no adenopathy, no carotid bruit, no JVD, supple, symmetrical, trachea midline and thyroid not enlarged, symmetric, no tenderness/mass/nodules  No CVA tenderness.  Lungs: clear to auscultation bilaterally  Breasts: normal appearance, no masses or tenderness Heart: regular rate and rhythm, S1, S2 normal, no murmur, click, rub or gallop  Abdomen: soft, non-tender; bowel sounds normal; no masses, no organomegaly   Musculoskeletal: ROM normal in all joints, no crepitus, no deformity, Normal muscle strengthen. Back is symmetric, no curvature. Skin: Skin color, texture, turgor normal. No rashes or lesions  Lymph nodes: Cervical, supraclavicular, and axillary nodes normal.  Neurologic: CN 2 -12 Normal, Normal symmetric reflexes. Normal coordination and gait  Psych: Alert & Oriented x 3, Mood appear stable.    Assessment:    Annual wellness medicare exam   Plan:    During the course of the visit the patient was educated and counseled about appropriate screening and preventive services including:   Annual PSA      Patient Instructions (the written plan) was given to the patient.  Medicare Attestation  I have personally reviewed:  The patient's medical and social history  Their use of alcohol, tobacco or illicit drugs  Their current medications and supplements  The patient's functional ability including ADLs,fall risks, home safety risks, cognitive, and hearing and visual impairment  Diet and physical activities  Evidence for depression or mood disorders  The patient's weight, height, BMI, and visual acuity have been recorded in the chart. I have made referrals, counseling, and provided education to the patient based on review of the above and I have provided the patient with a  written personalized care plan for preventive services.

## 2013-12-13 NOTE — Patient Instructions (Signed)
Continue same meds and return in 6 months. Prevnar given today

## 2013-12-13 NOTE — Progress Notes (Signed)
Patient ID: Jeffrey Wu., male   DOB: 1945-07-09, 68 y.o.   MRN: [id number] HPI  68 year old  White male for health maintenance and evaluation of medical problems. Has had aspirin  desensitivity procedure at [facility] . Recurrent sinusitis and allergic rhinitis. Hx BPH, hyperlipidemia. Hx sleep apnea and uses C-Pap machine.  Past medical history: History of anxiety depression. Appendectomy 1955. Thyroglossal duct cyst removed in 1990. Hernia repair 2009.  Social history: He is married. 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 Quit smoking several years ago and formerly smoked a pack of cigarettes daily for 46 years. Consumes 3 ounces of alcohol daily  Family history: Father died at age 47 of leukemia. Patient was 68 years old at the time. Mother died at age 38 of a stroke after heart surgery. One brother and one sister in good health. 3 adult children-2 sons and a daughter all of whom are in good health.      Review of Systems  Constitutional: Negative.  HENT: Negative.  Eyes:   Droopy eye lids  Respiratory: Negative.  Cardiovascular: Negative.  Gastrointestinal: Negative.  Endocrine:   Hyperlipidemia  Genitourinary:   BPH  Allergic/Immunologic: Positive for environmental allergies.  Hematological: Negative.  Psychiatric/Behavioral: Negative.       Objective:   Physical Exam  Constitutional: He is oriented to person, place, and time. He appears well-developed and well-nourished. No distress.  HENT:  Head: Normocephalic and atraumatic.  Right Ear: External ear normal.  Left Ear: External ear normal.  Mouth/Throat: Oropharynx is clear and moist. No oropharyngeal exudate.  Eyes: Conjunctivae and EOM are normal. Pupils are equal, round, and reactive to light. Right eye exhibits no discharge. Left eye exhibits no discharge. No scleral icterus.  Neck: Neck supple. No JVD present. No thyromegaly  present.  Cardiovascular: Normal rate, regular rhythm and normal heart sounds.  Pulmonary/Chest: Effort normal and breath sounds normal. He has no wheezes.  Abdominal: Soft. Bowel sounds are normal. He exhibits no distension and no mass. There is no tenderness. There is no guarding.  Genitourinary: Prostate normal.  Lymphadenopathy:   He has no cervical adenopathy.  Neurological: He is alert and oriented to person, place, and time. He has normal reflexes. No cranial nerve deficit. Coordination normal.  Skin: Skin is warm and dry. He is not diaphoretic.  Psychiatric: He has a normal mood and affect. His behavior is normal. Judgment and thought content normal.          Assessment & Plan:  Allergic rhinitis  Sleep apnea  History of anxiety depression  Hyperlipidemia  Plan:  Return in 6 months as needed. Prevnar given    Subjective:   Patient presents for Medicare Annual/Subsequent preventive examination.   Review Past Medical/Family/Social: see above   Risk Factors  Current exercise habits: walk 2-3 times a week Dietary issues discussed: low fat low carb Weight Watchers diet since August 2013  Cardiac risk factors: hyperlipidemia  Depression Screen  (Note: if answer to either of the following is "Yes", a more complete depression screening is indicated)   Over the past two weeks, have you felt down, depressed or hopeless? No  Over the past two weeks, have you felt little interest or pleasure in doing things? No Have you lost interest or pleasure in daily life? No Do you often feel hopeless? No Do you cry easily over simple problems? No   Activities of Daily Living  In  your present state of health, do you have any difficulty performing the following activities?:   Driving? No  Managing money? No  Feeding yourself? No  Getting from bed to chair? No  Climbing a flight of stairs? No  Preparing food and eating?: No  Bathing or showering? No   Getting dressed: No  Getting to the toilet? No  Using the toilet:No  Moving around from place to place: No  In the past year have you fallen or had a near fall?:No  Are you sexually active? No  Do you have more than one partner? No   Hearing Difficulties: No  Do you often ask people to speak up or repeat themselves? No  Do you experience ringing or noises in your ears? No  Do you have difficulty understanding soft or whispered voices? No  Do you feel that you have a problem with memory? No Do you often misplace items? No    Home Safety:  Do you have a smoke alarm at your residence? Yes Do you have grab bars in the bathroom? yes Do you have throw rugs in your house? yes   Cognitive Testing  Alert? Yes Normal Appearance?Yes  Oriented to person? Yes Place? Yes  Time? Yes  Recall of three objects? Yes  Can perform simple calculations? Yes  Displays appropriate judgment?Yes  Can read the correct time from a watch face?Yes   List the Names of Other Physician/Practitioners you currently use:  See referral list for the physicians patient is currently seeing.  Dr. Harold Hedge because of allergic rhinitis   Review of Systems: see EPIC   Objective:     General appearance: Appears stated age  Head: Normocephalic, without obvious abnormality, atraumatic  Eyes: conj clear, EOMi PEERLA  Ears: normal TM's and external ear canals both ears  Nose: Nares normal. Septum midline. Mucosa normal. No drainage or sinus tenderness.  Throat: lips, mucosa, and tongue normal; teeth and gums normal  Neck: no adenopathy, no carotid bruit, no JVD, supple, symmetrical, trachea midline and thyroid not enlarged, symmetric, no tenderness/mass/nodules  No CVA tenderness.  Lungs: clear to auscultation bilaterally  Breasts: normal appearance, no masses or tenderness Heart: regular rate and rhythm, S1, S2 normal, no murmur, click, rub or gallop  Abdomen: soft,  non-tender; bowel sounds normal; no masses, no organomegaly  Musculoskeletal: ROM normal in all joints, no crepitus, no deformity, Normal muscle strengthen. Back is symmetric, no curvature. Skin: Skin color, texture, turgor normal. No rashes or lesions  Lymph nodes: Cervical, supraclavicular, and axillary nodes normal.  Neurologic: CN 2 -12 Normal, Normal symmetric reflexes. Normal coordination and gait  Psych: Alert & Oriented x 3, Mood appear stable.    Assessment:    Annual wellness medicare exam   Plan:    During the course of the visit the patient was educated and counseled about appropriate screening and preventive services including:   Annual PSA      Patient Instructions (the written plan) was given to the patient.  Medicare Attestation  I have personally reviewed:  The patient's medical and social history  Their use of alcohol, tobacco or illicit drugs  Their current medications and supplements  The patient's functional ability including ADLs,fall risks, home safety risks, cognitive, and hearing and visual impairment  Diet and physical activities  Evidence for depression or mood disorders  The patient's weight, height, BMI, and visual acuity have been recorded in the chart. I have made referrals, counseling, and provided education to the patient  based on review of the above and I have provided the patient with a written personalized care plan for preventive services.

## 2013-12-14 LAB — PSA: PSA: 0.34 ng/mL (ref <=4.00)

## 2013-12-17 DIAGNOSIS — T39015D Adverse effect of aspirin, subsequent encounter: Secondary | ICD-10-CM | POA: Diagnosis not present

## 2013-12-17 DIAGNOSIS — J45909 Unspecified asthma, uncomplicated: Secondary | ICD-10-CM | POA: Diagnosis not present

## 2013-12-17 DIAGNOSIS — J324 Chronic pansinusitis: Secondary | ICD-10-CM | POA: Diagnosis not present

## 2013-12-17 DIAGNOSIS — J339 Nasal polyp, unspecified: Secondary | ICD-10-CM | POA: Diagnosis not present

## 2013-12-17 DIAGNOSIS — J45998 Other asthma: Secondary | ICD-10-CM | POA: Diagnosis not present

## 2013-12-17 DIAGNOSIS — T39015A Adverse effect of aspirin, initial encounter: Secondary | ICD-10-CM | POA: Diagnosis not present

## 2013-12-17 DIAGNOSIS — J329 Chronic sinusitis, unspecified: Secondary | ICD-10-CM | POA: Diagnosis not present

## 2013-12-17 DIAGNOSIS — T7840XA Allergy, unspecified, initial encounter: Secondary | ICD-10-CM | POA: Diagnosis not present

## 2013-12-17 DIAGNOSIS — H6692 Otitis media, unspecified, left ear: Secondary | ICD-10-CM | POA: Diagnosis not present

## 2013-12-29 DIAGNOSIS — J020 Streptococcal pharyngitis: Secondary | ICD-10-CM | POA: Diagnosis not present

## 2014-01-12 ENCOUNTER — Encounter: Payer: Self-pay | Admitting: Internal Medicine

## 2014-01-12 NOTE — Progress Notes (Signed)
   Subjective:    Patient ID: Jeffrey Wu., male    DOB: August 18, 1945, 69 y.o.   MRN: [id number]  HPI  Recent onset of diarrhea with multiple episodes. No blood in stool. No melena. No abdominal pain. No recent travel history. No one else at home is ill. No chills or fever. No vomiting.    Review of Systems     Objective:   Physical Exam  Pharynx is clear. Neck is supple. Chest clear. Cardiac exam regular rate and rhythm. Abdomen is soft nondistended without hepatosplenomegaly masses or significant tenderness      Assessment & Plan:  Gastroenteritis  Plan: Clear liquids until diarrhea resolves and then advance diet slowly. Avoid dairy products for 7-10 days. If symptoms persist, obtain stool studies. Addendum: CBC and urinalysis are normal

## 2014-01-12 NOTE — Patient Instructions (Signed)
Clear liquids and advance diet slowly.

## 2014-01-18 ENCOUNTER — Ambulatory Visit: Payer: Medicare Other | Admitting: Pulmonary Disease

## 2014-01-28 ENCOUNTER — Other Ambulatory Visit: Payer: Self-pay | Admitting: Internal Medicine

## 2014-02-20 DIAGNOSIS — J45998 Other asthma: Secondary | ICD-10-CM | POA: Diagnosis not present

## 2014-02-20 DIAGNOSIS — G4733 Obstructive sleep apnea (adult) (pediatric): Secondary | ICD-10-CM | POA: Diagnosis not present

## 2014-02-20 DIAGNOSIS — J324 Chronic pansinusitis: Secondary | ICD-10-CM | POA: Diagnosis not present

## 2014-02-20 DIAGNOSIS — J45909 Unspecified asthma, uncomplicated: Secondary | ICD-10-CM | POA: Diagnosis not present

## 2014-02-20 DIAGNOSIS — J329 Chronic sinusitis, unspecified: Secondary | ICD-10-CM | POA: Diagnosis not present

## 2014-02-20 DIAGNOSIS — T39015D Adverse effect of aspirin, subsequent encounter: Secondary | ICD-10-CM | POA: Diagnosis not present

## 2014-02-20 DIAGNOSIS — Z87891 Personal history of nicotine dependence: Secondary | ICD-10-CM | POA: Diagnosis not present

## 2014-02-20 DIAGNOSIS — Z7982 Long term (current) use of aspirin: Secondary | ICD-10-CM | POA: Diagnosis not present

## 2014-02-20 DIAGNOSIS — J339 Nasal polyp, unspecified: Secondary | ICD-10-CM | POA: Diagnosis not present

## 2014-02-20 DIAGNOSIS — H6692 Otitis media, unspecified, left ear: Secondary | ICD-10-CM | POA: Diagnosis not present

## 2014-03-27 DIAGNOSIS — Z888 Allergy status to other drugs, medicaments and biological substances status: Secondary | ICD-10-CM | POA: Diagnosis not present

## 2014-03-27 DIAGNOSIS — Z79899 Other long term (current) drug therapy: Secondary | ICD-10-CM | POA: Diagnosis not present

## 2014-03-27 DIAGNOSIS — Z7982 Long term (current) use of aspirin: Secondary | ICD-10-CM | POA: Diagnosis not present

## 2014-03-27 DIAGNOSIS — J45998 Other asthma: Secondary | ICD-10-CM | POA: Diagnosis not present

## 2014-03-27 DIAGNOSIS — Z87891 Personal history of nicotine dependence: Secondary | ICD-10-CM | POA: Diagnosis not present

## 2014-03-27 DIAGNOSIS — H6692 Otitis media, unspecified, left ear: Secondary | ICD-10-CM | POA: Diagnosis not present

## 2014-03-27 DIAGNOSIS — J339 Nasal polyp, unspecified: Secondary | ICD-10-CM | POA: Diagnosis not present

## 2014-03-27 DIAGNOSIS — J329 Chronic sinusitis, unspecified: Secondary | ICD-10-CM | POA: Diagnosis not present

## 2014-03-27 DIAGNOSIS — G4733 Obstructive sleep apnea (adult) (pediatric): Secondary | ICD-10-CM | POA: Diagnosis not present

## 2014-03-27 DIAGNOSIS — T39015D Adverse effect of aspirin, subsequent encounter: Secondary | ICD-10-CM | POA: Diagnosis not present

## 2014-03-27 DIAGNOSIS — Z7952 Long term (current) use of systemic steroids: Secondary | ICD-10-CM | POA: Diagnosis not present

## 2014-03-27 DIAGNOSIS — J45909 Unspecified asthma, uncomplicated: Secondary | ICD-10-CM | POA: Diagnosis not present

## 2014-03-27 DIAGNOSIS — H6592 Unspecified nonsuppurative otitis media, left ear: Secondary | ICD-10-CM | POA: Diagnosis not present

## 2014-04-10 DIAGNOSIS — M25572 Pain in left ankle and joints of left foot: Secondary | ICD-10-CM | POA: Diagnosis not present

## 2014-04-10 DIAGNOSIS — S93402A Sprain of unspecified ligament of left ankle, initial encounter: Secondary | ICD-10-CM | POA: Diagnosis not present

## 2014-04-22 DIAGNOSIS — T394X1A Poisoning by antirheumatics, not elsewhere classified, accidental (unintentional), initial encounter: Secondary | ICD-10-CM | POA: Diagnosis not present

## 2014-04-22 DIAGNOSIS — J339 Nasal polyp, unspecified: Secondary | ICD-10-CM | POA: Diagnosis not present

## 2014-04-22 DIAGNOSIS — J4540 Moderate persistent asthma, uncomplicated: Secondary | ICD-10-CM | POA: Diagnosis not present

## 2014-04-22 DIAGNOSIS — J3089 Other allergic rhinitis: Secondary | ICD-10-CM | POA: Diagnosis not present

## 2014-04-22 DIAGNOSIS — J988 Other specified respiratory disorders: Secondary | ICD-10-CM | POA: Diagnosis not present

## 2014-04-22 DIAGNOSIS — J45998 Other asthma: Secondary | ICD-10-CM | POA: Diagnosis not present

## 2014-04-24 DIAGNOSIS — S93402D Sprain of unspecified ligament of left ankle, subsequent encounter: Secondary | ICD-10-CM | POA: Diagnosis not present

## 2014-04-24 DIAGNOSIS — M25572 Pain in left ankle and joints of left foot: Secondary | ICD-10-CM | POA: Diagnosis not present

## 2014-06-05 DIAGNOSIS — J45909 Unspecified asthma, uncomplicated: Secondary | ICD-10-CM | POA: Diagnosis not present

## 2014-06-05 DIAGNOSIS — J339 Nasal polyp, unspecified: Secondary | ICD-10-CM | POA: Diagnosis not present

## 2014-06-05 DIAGNOSIS — Z7952 Long term (current) use of systemic steroids: Secondary | ICD-10-CM | POA: Diagnosis not present

## 2014-06-05 DIAGNOSIS — Z886 Allergy status to analgesic agent status: Secondary | ICD-10-CM | POA: Diagnosis not present

## 2014-06-05 DIAGNOSIS — J329 Chronic sinusitis, unspecified: Secondary | ICD-10-CM | POA: Diagnosis not present

## 2014-06-05 DIAGNOSIS — T39015D Adverse effect of aspirin, subsequent encounter: Secondary | ICD-10-CM | POA: Diagnosis not present

## 2014-06-05 DIAGNOSIS — G4733 Obstructive sleep apnea (adult) (pediatric): Secondary | ICD-10-CM | POA: Diagnosis not present

## 2014-06-05 DIAGNOSIS — E785 Hyperlipidemia, unspecified: Secondary | ICD-10-CM | POA: Diagnosis not present

## 2014-06-05 DIAGNOSIS — H6692 Otitis media, unspecified, left ear: Secondary | ICD-10-CM | POA: Diagnosis not present

## 2014-06-05 DIAGNOSIS — Z7982 Long term (current) use of aspirin: Secondary | ICD-10-CM | POA: Diagnosis not present

## 2014-06-05 DIAGNOSIS — J45998 Other asthma: Secondary | ICD-10-CM | POA: Diagnosis not present

## 2014-06-05 DIAGNOSIS — Z87891 Personal history of nicotine dependence: Secondary | ICD-10-CM | POA: Diagnosis not present

## 2014-06-06 ENCOUNTER — Other Ambulatory Visit: Payer: Self-pay | Admitting: Internal Medicine

## 2014-06-13 ENCOUNTER — Other Ambulatory Visit: Payer: Self-pay | Admitting: Internal Medicine

## 2014-06-13 ENCOUNTER — Other Ambulatory Visit: Payer: Medicare Other | Admitting: Internal Medicine

## 2014-06-13 DIAGNOSIS — E785 Hyperlipidemia, unspecified: Secondary | ICD-10-CM | POA: Diagnosis not present

## 2014-06-13 DIAGNOSIS — Z79899 Other long term (current) drug therapy: Secondary | ICD-10-CM | POA: Diagnosis not present

## 2014-06-13 DIAGNOSIS — R7302 Impaired glucose tolerance (oral): Secondary | ICD-10-CM | POA: Diagnosis not present

## 2014-06-13 LAB — LIPID PANEL
CHOLESTEROL: 175 mg/dL (ref 0–200)
HDL: 75 mg/dL (ref >=40)
LDL CALC: 86 mg/dL (ref 0–99)
TRIGLYCERIDES: 71 mg/dL (ref <150)
Total CHOL/HDL Ratio: 2.3 Ratio
VLDL: 14 mg/dL (ref 0–40)

## 2014-06-13 LAB — HEPATIC FUNCTION PANEL
ALT: 20 U/L (ref 0–53)
AST: 21 U/L (ref 0–37)
Albumin: 3.6 g/dL (ref 3.5–5.2)
Alkaline Phosphatase: 67 U/L (ref 39–117)
BILIRUBIN DIRECT: 0.1 mg/dL (ref 0.0–0.3)
BILIRUBIN TOTAL: 0.4 mg/dL (ref 0.2–1.2)
Indirect Bilirubin: 0.3 mg/dL (ref 0.2–1.2)
Total Protein: 6.5 g/dL (ref 6.0–8.3)

## 2014-06-14 ENCOUNTER — Encounter: Payer: Self-pay | Admitting: Internal Medicine

## 2014-06-14 ENCOUNTER — Ambulatory Visit (INDEPENDENT_AMBULATORY_CARE_PROVIDER_SITE_OTHER): Payer: Medicare Other | Admitting: Internal Medicine

## 2014-06-14 ENCOUNTER — Telehealth: Payer: Self-pay | Admitting: *Deleted

## 2014-06-14 VITALS — BP 102/60 | HR 58 | Temp 98.1°F | Wt 185.5 lb

## 2014-06-14 DIAGNOSIS — J339 Nasal polyp, unspecified: Secondary | ICD-10-CM | POA: Diagnosis not present

## 2014-06-14 DIAGNOSIS — J309 Allergic rhinitis, unspecified: Secondary | ICD-10-CM | POA: Diagnosis not present

## 2014-06-14 DIAGNOSIS — L309 Dermatitis, unspecified: Secondary | ICD-10-CM | POA: Diagnosis not present

## 2014-06-14 DIAGNOSIS — E785 Hyperlipidemia, unspecified: Secondary | ICD-10-CM | POA: Diagnosis not present

## 2014-06-14 DIAGNOSIS — N400 Enlarged prostate without lower urinary tract symptoms: Secondary | ICD-10-CM

## 2014-06-14 DIAGNOSIS — L570 Actinic keratosis: Secondary | ICD-10-CM | POA: Diagnosis not present

## 2014-06-14 DIAGNOSIS — R7302 Impaired glucose tolerance (oral): Secondary | ICD-10-CM | POA: Insufficient documentation

## 2014-06-14 DIAGNOSIS — F32A Depression, unspecified: Secondary | ICD-10-CM

## 2014-06-14 DIAGNOSIS — J329 Chronic sinusitis, unspecified: Secondary | ICD-10-CM

## 2014-06-14 DIAGNOSIS — F329 Major depressive disorder, single episode, unspecified: Secondary | ICD-10-CM | POA: Diagnosis not present

## 2014-06-14 LAB — HEMOGLOBIN A1C
Hgb A1c MFr Bld: 6 % — ABNORMAL HIGH (ref <5.7)
Mean Plasma Glucose: 126 mg/dL — ABNORMAL HIGH (ref <117)

## 2014-06-14 NOTE — Patient Instructions (Addendum)
Continue same medications and return in 6 months for Medicare wellness exam. 11 A1c to be added to lab work. Lipid panel liver functions are normal. See dermatologist.

## 2014-06-14 NOTE — Telephone Encounter (Signed)
Reviewed lab results with patient.

## 2014-06-14 NOTE — Progress Notes (Signed)
   Subjective:    Patient ID: Jeffrey Wu., male    DOB: October 21, 1945, 69 y.o.   MRN: [id number]  HPI He is now back on course of prednisone for chronic sinusitis and nasal polyps. Says he will eventually wind up on prednisone 5 mg daily. History of impaired glucose tolerance. Hemoglobin A1c was 6% in 2014. This will be repeated today. His lipid panel and liver functions are entirely normal on statin medication. He is on Crestor 10 mg daily. History of depression treated with Paxil. Remains on aspirin 325 mg daily. He has new issues involving his scan. Has an area in left temple, right postauricular area and hand dermatitis. Leaving to go to [place] on vacation with grandchildren and family.    Review of Systems     Objective:   Physical Exam He has cracking and excoriations on his fingers bilaterally consistent with hand dermatitis. He has a pearly lesion right postauricular area that needs to be checked by dermatologist. He has actinic keratosis left temple that needs to be checked by dermatologist.  Neck is supple without JVD thyromegaly or carotid bruits. Chest clear to auscultation. Cardiac exam regular rate and rhythm normal S1 and S2. Extremities without edema     Assessment & Plan:  Hyperlipidemia-stable on Crestor  Multiple skin lesions-- needs dermatology evaluation. Need to rule out basal cell carcinoma of right postauricular area. Likely has actinic keratosis left temple and hand dermatitis. I have prescribed sterilely cream for hand dermatitis previously and he says it did not work. He will see Dr. Allyson Sabal whom he has seen in the past  History of depression-stable on Paxil  Allergic rhinitis, chronic sinusitis, nasal polyposis-will be placed on prednisone 5 mg daily seen by ENT at [facility]  BPH stable on [place]: Return in 6 months for Medicare physical examination. Continue same medications.

## 2014-07-04 DIAGNOSIS — L2082 Flexural eczema: Secondary | ICD-10-CM | POA: Diagnosis not present

## 2014-07-04 DIAGNOSIS — L570 Actinic keratosis: Secondary | ICD-10-CM | POA: Diagnosis not present

## 2014-07-17 DIAGNOSIS — Z9889 Other specified postprocedural states: Secondary | ICD-10-CM | POA: Diagnosis not present

## 2014-07-17 DIAGNOSIS — Z79899 Other long term (current) drug therapy: Secondary | ICD-10-CM | POA: Diagnosis not present

## 2014-07-17 DIAGNOSIS — J329 Chronic sinusitis, unspecified: Secondary | ICD-10-CM | POA: Diagnosis not present

## 2014-07-17 DIAGNOSIS — J45998 Other asthma: Secondary | ICD-10-CM | POA: Diagnosis not present

## 2014-07-17 DIAGNOSIS — Z886 Allergy status to analgesic agent status: Secondary | ICD-10-CM | POA: Diagnosis not present

## 2014-07-17 DIAGNOSIS — T39015D Adverse effect of aspirin, subsequent encounter: Secondary | ICD-10-CM | POA: Diagnosis not present

## 2014-07-17 DIAGNOSIS — Z7952 Long term (current) use of systemic steroids: Secondary | ICD-10-CM | POA: Diagnosis not present

## 2014-07-17 DIAGNOSIS — Z87891 Personal history of nicotine dependence: Secondary | ICD-10-CM | POA: Diagnosis not present

## 2014-07-17 DIAGNOSIS — J45909 Unspecified asthma, uncomplicated: Secondary | ICD-10-CM | POA: Diagnosis not present

## 2014-07-17 DIAGNOSIS — J339 Nasal polyp, unspecified: Secondary | ICD-10-CM | POA: Diagnosis not present

## 2014-07-17 DIAGNOSIS — Z8669 Personal history of other diseases of the nervous system and sense organs: Secondary | ICD-10-CM | POA: Diagnosis not present

## 2014-07-17 DIAGNOSIS — J310 Chronic rhinitis: Secondary | ICD-10-CM | POA: Diagnosis not present

## 2014-07-17 DIAGNOSIS — E785 Hyperlipidemia, unspecified: Secondary | ICD-10-CM | POA: Diagnosis not present

## 2014-07-22 DIAGNOSIS — T394X1A Poisoning by antirheumatics, not elsewhere classified, accidental (unintentional), initial encounter: Secondary | ICD-10-CM | POA: Diagnosis not present

## 2014-07-22 DIAGNOSIS — J4530 Mild persistent asthma, uncomplicated: Secondary | ICD-10-CM | POA: Diagnosis not present

## 2014-07-22 DIAGNOSIS — J339 Nasal polyp, unspecified: Secondary | ICD-10-CM | POA: Diagnosis not present

## 2014-07-22 DIAGNOSIS — J988 Other specified respiratory disorders: Secondary | ICD-10-CM | POA: Diagnosis not present

## 2014-07-29 DIAGNOSIS — H25013 Cortical age-related cataract, bilateral: Secondary | ICD-10-CM | POA: Diagnosis not present

## 2014-07-29 DIAGNOSIS — H2513 Age-related nuclear cataract, bilateral: Secondary | ICD-10-CM | POA: Diagnosis not present

## 2014-07-29 DIAGNOSIS — H43313 Vitreous membranes and strands, bilateral: Secondary | ICD-10-CM | POA: Diagnosis not present

## 2014-10-02 ENCOUNTER — Other Ambulatory Visit: Payer: Self-pay | Admitting: Internal Medicine

## 2014-11-18 ENCOUNTER — Other Ambulatory Visit: Payer: Self-pay | Admitting: Internal Medicine

## 2014-11-18 DIAGNOSIS — Z79899 Other long term (current) drug therapy: Secondary | ICD-10-CM | POA: Diagnosis not present

## 2014-11-18 DIAGNOSIS — E785 Hyperlipidemia, unspecified: Secondary | ICD-10-CM | POA: Diagnosis not present

## 2014-11-18 DIAGNOSIS — Z886 Allergy status to analgesic agent status: Secondary | ICD-10-CM | POA: Diagnosis not present

## 2014-11-18 DIAGNOSIS — Z7982 Long term (current) use of aspirin: Secondary | ICD-10-CM | POA: Diagnosis not present

## 2014-11-18 DIAGNOSIS — Z7952 Long term (current) use of systemic steroids: Secondary | ICD-10-CM | POA: Diagnosis not present

## 2014-11-18 DIAGNOSIS — Z8669 Personal history of other diseases of the nervous system and sense organs: Secondary | ICD-10-CM | POA: Diagnosis not present

## 2014-11-18 DIAGNOSIS — Z87891 Personal history of nicotine dependence: Secondary | ICD-10-CM | POA: Diagnosis not present

## 2014-11-18 DIAGNOSIS — H6522 Chronic serous otitis media, left ear: Secondary | ICD-10-CM | POA: Diagnosis not present

## 2014-11-18 DIAGNOSIS — J45909 Unspecified asthma, uncomplicated: Secondary | ICD-10-CM | POA: Diagnosis not present

## 2014-11-18 DIAGNOSIS — J339 Nasal polyp, unspecified: Secondary | ICD-10-CM | POA: Diagnosis not present

## 2014-11-18 DIAGNOSIS — J324 Chronic pansinusitis: Secondary | ICD-10-CM | POA: Diagnosis not present

## 2014-12-05 ENCOUNTER — Other Ambulatory Visit: Payer: Self-pay | Admitting: Internal Medicine

## 2014-12-12 ENCOUNTER — Other Ambulatory Visit: Payer: Self-pay | Admitting: Internal Medicine

## 2014-12-12 ENCOUNTER — Other Ambulatory Visit: Payer: Medicare Other | Admitting: Internal Medicine

## 2014-12-12 DIAGNOSIS — R7309 Other abnormal glucose: Secondary | ICD-10-CM

## 2014-12-12 DIAGNOSIS — M8580 Other specified disorders of bone density and structure, unspecified site: Secondary | ICD-10-CM | POA: Diagnosis not present

## 2014-12-12 DIAGNOSIS — E785 Hyperlipidemia, unspecified: Secondary | ICD-10-CM

## 2014-12-12 DIAGNOSIS — Z79899 Other long term (current) drug therapy: Secondary | ICD-10-CM | POA: Diagnosis not present

## 2014-12-12 DIAGNOSIS — N400 Enlarged prostate without lower urinary tract symptoms: Secondary | ICD-10-CM

## 2014-12-12 DIAGNOSIS — Z1321 Encounter for screening for nutritional disorder: Secondary | ICD-10-CM | POA: Diagnosis not present

## 2014-12-12 DIAGNOSIS — Z0000 Encounter for general adult medical examination without abnormal findings: Secondary | ICD-10-CM | POA: Diagnosis not present

## 2014-12-12 DIAGNOSIS — Z7952 Long term (current) use of systemic steroids: Secondary | ICD-10-CM | POA: Diagnosis not present

## 2014-12-12 DIAGNOSIS — R7302 Impaired glucose tolerance (oral): Secondary | ICD-10-CM | POA: Diagnosis not present

## 2014-12-12 LAB — CBC WITH DIFFERENTIAL/PLATELET
BASOS PCT: 0 % (ref 0–1)
Basophils Absolute: 0 10*3/uL (ref 0.0–0.1)
Eosinophils Absolute: 0.5 10*3/uL (ref 0.0–0.7)
Eosinophils Relative: 10 % — ABNORMAL HIGH (ref 0–5)
HCT: 46.1 % (ref 39.0–52.0)
HEMOGLOBIN: 15.2 g/dL (ref 13.0–17.0)
Lymphocytes Relative: 34 % (ref 12–46)
Lymphs Abs: 1.8 10*3/uL (ref 0.7–4.0)
MCH: 31.9 pg (ref 26.0–34.0)
MCHC: 33 g/dL (ref 30.0–36.0)
MCV: 96.8 fL (ref 78.0–100.0)
MPV: 11.2 fL (ref 8.6–12.4)
Monocytes Absolute: 0.6 10*3/uL (ref 0.1–1.0)
Monocytes Relative: 11 % (ref 3–12)
NEUTROS ABS: 2.4 10*3/uL (ref 1.7–7.7)
Neutrophils Relative %: 45 % (ref 43–77)
Platelets: 159 10*3/uL (ref 150–400)
RBC: 4.76 MIL/uL (ref 4.22–5.81)
RDW: 13.1 % (ref 11.5–15.5)
WBC: 5.3 10*3/uL (ref 4.0–10.5)

## 2014-12-12 LAB — LIPID PANEL
CHOLESTEROL: 196 mg/dL (ref 125–200)
HDL: 95 mg/dL (ref >=40)
LDL Cholesterol: 86 mg/dL (ref <130)
TRIGLYCERIDES: 75 mg/dL (ref <150)
Total CHOL/HDL Ratio: 2.1 Ratio (ref <=5.0)
VLDL: 15 mg/dL (ref <30)

## 2014-12-13 LAB — HEMOGLOBIN A1C
HEMOGLOBIN A1C: 6 % — AB (ref <5.7)
MEAN PLASMA GLUCOSE: 126 mg/dL — AB (ref <117)

## 2014-12-13 LAB — PSA: PSA: 0.44 ng/mL (ref <=4.00)

## 2014-12-16 ENCOUNTER — Ambulatory Visit (INDEPENDENT_AMBULATORY_CARE_PROVIDER_SITE_OTHER): Payer: Medicare Other | Admitting: Internal Medicine

## 2014-12-16 ENCOUNTER — Encounter: Payer: Self-pay | Admitting: Internal Medicine

## 2014-12-16 VITALS — BP 122/70 | HR 60 | Temp 97.6°F | Resp 20 | Ht 73.0 in | Wt 189.0 lb

## 2014-12-16 DIAGNOSIS — Z0000 Encounter for general adult medical examination without abnormal findings: Secondary | ICD-10-CM

## 2014-12-16 DIAGNOSIS — J309 Allergic rhinitis, unspecified: Secondary | ICD-10-CM | POA: Diagnosis not present

## 2014-12-16 DIAGNOSIS — R7302 Impaired glucose tolerance (oral): Secondary | ICD-10-CM | POA: Diagnosis not present

## 2014-12-16 DIAGNOSIS — E785 Hyperlipidemia, unspecified: Secondary | ICD-10-CM | POA: Diagnosis not present

## 2014-12-16 DIAGNOSIS — Z23 Encounter for immunization: Secondary | ICD-10-CM

## 2014-12-16 DIAGNOSIS — F32A Depression, unspecified: Secondary | ICD-10-CM

## 2014-12-16 DIAGNOSIS — E559 Vitamin D deficiency, unspecified: Secondary | ICD-10-CM | POA: Diagnosis not present

## 2014-12-16 DIAGNOSIS — F329 Major depressive disorder, single episode, unspecified: Secondary | ICD-10-CM | POA: Diagnosis not present

## 2014-12-16 DIAGNOSIS — J329 Chronic sinusitis, unspecified: Secondary | ICD-10-CM | POA: Diagnosis not present

## 2014-12-16 DIAGNOSIS — N400 Enlarged prostate without lower urinary tract symptoms: Secondary | ICD-10-CM

## 2014-12-16 LAB — POCT URINALYSIS DIPSTICK
BILIRUBIN UA: NEGATIVE
Blood, UA: NEGATIVE
GLUCOSE UA: NEGATIVE
KETONES UA: NEGATIVE
LEUKOCYTES UA: NEGATIVE
Nitrite, UA: 0.2
Protein, UA: NEGATIVE
Spec Grav, UA: 1.02
Urobilinogen, UA: NEGATIVE
pH, UA: 6.5

## 2014-12-17 LAB — VITAMIN D 25 HYDROXY (VIT D DEFICIENCY, FRACTURES): VIT D 25 HYDROXY: 21 ng/mL — AB (ref 30–100)

## 2015-01-03 DIAGNOSIS — J069 Acute upper respiratory infection, unspecified: Secondary | ICD-10-CM | POA: Diagnosis not present

## 2015-01-03 DIAGNOSIS — R05 Cough: Secondary | ICD-10-CM | POA: Diagnosis not present

## 2015-01-03 DIAGNOSIS — J302 Other seasonal allergic rhinitis: Secondary | ICD-10-CM | POA: Diagnosis not present

## 2015-01-04 NOTE — Progress Notes (Signed)
Subjective:    Patient ID: Jeffrey Feathers., male    DOB: 1945/04/20, 69 y.o.   MRN: [id number]  HPI    69 year old White Male in today for health maintenance exam and evaluation of medical issues. History of recurrent sinusitis and allergic rhinitis. He's treated at [facility] spend this [facility]. He had aspirin D sensitivity procedure there. History of sleep apnea and uses C Pap. History of BPH and hyperlipidemia. History of depression.  Past medical history: Appendectomy 1955. Thyroglossal duct cyst removed in 1990. Hernia repair 2009.  Social history: He is married. 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 Quit smoking several years ago. Formerly smoked pack cigarettes daily for some 46 years. Consumes 3 ounces of alcohol daily.  Family history: Father died at age 20 of leukemia. Patient was 69 years old at the time. Mother died at age 38 of a stroke after heart surgery. One brother and one sister in good health. 3 adult children-2 sons and a daughter all of whom are in good health.    Review of Systems  Constitutional: Negative.        Objective:   Physical Exam  Constitutional: He is oriented to person, place, and time. He appears well-developed and well-nourished. No distress.  HENT:  Head: Normocephalic and atraumatic.  Right Ear: External ear normal.  Left Ear: External ear normal.  Mouth/Throat: Oropharynx is clear and moist. No oropharyngeal exudate.  Eyes: Conjunctivae and EOM are normal. Pupils are equal, round, and reactive to light. Right eye exhibits no discharge. Left eye exhibits no discharge. No scleral icterus.  Neck: Neck supple. No JVD present. No thyromegaly present.  Cardiovascular: Normal rate, regular rhythm, normal heart sounds and intact distal pulses.   No murmur heard. Pulmonary/Chest: Effort normal and breath sounds normal. No respiratory distress. He has no wheezes. He has no rales.  Abdominal:  Soft. Bowel sounds are normal. He exhibits no distension and no mass. There is no tenderness. There is no rebound and no guarding.  Genitourinary: Prostate normal.  Musculoskeletal: He exhibits no edema.  Lymphadenopathy:    He has no cervical adenopathy.  Neurological: He is alert and oriented to person, place, and time. He has normal reflexes. No cranial nerve deficit. Coordination normal.  Skin: Skin is warm and dry. No rash noted. He is not diaphoretic.  Psychiatric: He has a normal mood and affect. His behavior is normal. Judgment and thought content normal.  Vitals reviewed.         Assessment & Plan:  Normal health maintenance exam  Allergic rhinitis  Sleep apnea  History of depression  Hyperlipidemia  BPH  Recurrent sinusitis  Vitamin D deficiency  Impaired glucose tolerance  Plan: Continue same medications and return in 6 months. Since he is on chronic prednisone therapy, he has asked we check vitamin D level. Vitamin D level is low at 21. Take 2000 units vitamin D 3 daily  Subjective:   Patient presents for Medicare Annual/Subsequent preventive examination.  Review Past Medical/Family/Social: See above   Risk Factors  Current exercise habits: Walks Dietary issues discussed: Low fat low car  Cardiac risk factors: Hyperlipidemia  Depression Screen not depressed (Note: if answer to either of the following is "Yes", a more complete depression screening is indicated)   Over the past two weeks, have you felt down, depressed or hopeless? No  Over the past two weeks, have you felt little interest  or pleasure in doing things? No Have you lost interest or pleasure in daily life? No Do you often feel hopeless? No Do you cry easily over simple problems? No   Activities of Daily Living  In your present state of health, do you have any difficulty performing the following activities?:   Driving? No  Managing money? No  Feeding yourself? No  Getting from bed  to chair? No  Climbing a flight of stairs? No  Preparing food and eating?: No  Bathing or showering? No  Getting dressed: No  Getting to the toilet? No  Using the toilet:No  Moving around from place to place: No  In the past year have you fallen or had a near fall?: Yes Are you sexually active? No  Do you have more than one partner? No   Hearing Difficulties: No  Do you often ask people to speak up or repeat themselves? Yes Do you experience ringing or noises in your ears? Yes Do you have difficulty understanding soft or whispered voices? Yes Do you feel that you have a problem with memory? Very occasionally Do you often misplace items? No    Home Safety:  Do you have a smoke alarm at your residence? Yes Do you have grab bars in the bathroom? Yes Do you have throw rugs in your house? Yes   Cognitive Testing  Alert? Yes Normal Appearance?Yes  Oriented to person? Yes Place? Yes  Time? Yes  Recall of three objects? Yes  Can perform simple calculations? Yes  Displays appropriate judgment?Yes  Can read the correct time from a watch face?Yes   List the Names of Other Physician/Practitioners you currently use:  See referral list for the physicians patient is currently seeing.  Seen at [facility] for recurrent sinusitis   Review of Systems: See above   Objective:     General appearance: Appears stated age  Head: Normocephalic, without obvious abnormality, atraumatic  Eyes: conj clear, EOMi PEERLA  Ears: normal TM's and external ear canals both ears  Nose: Nares normal. Septum midline. Mucosa normal. No drainage or sinus tenderness.  Throat: lips, mucosa, and tongue normal; teeth and gums normal  Neck: no adenopathy, no carotid bruit, no JVD, supple, symmetrical, trachea midline and thyroid not enlarged, symmetric, no tenderness/mass/nodules  No CVA tenderness.  Lungs: clear to auscultation bilaterally  Breasts: normal appearance, no masses or  tenderness Heart: regular rate and rhythm, S1, S2 normal, no murmur, click, rub or gallop  Abdomen: soft, non-tender; bowel sounds normal; no masses, no organomegaly  Musculoskeletal: ROM normal in all joints, no crepitus, no deformity, Normal muscle strengthen. Back  is symmetric, no curvature. Skin: Skin color, texture, turgor normal. No rashes or lesions  Lymph nodes: Cervical, supraclavicular, and axillary nodes normal.  Neurologic: CN 2 -12 Normal, Normal symmetric reflexes. Normal coordination and gait  Psych: Alert & Oriented x 3, Mood appear stable.    Assessment:    Annual wellness medicare exam   Plan:    During the course of the visit the patient was educated and counseled about appropriate screening and preventive services including:  Annual flu vaccine      Patient Instructions (the written plan) was given to the patient.  Medicare Attestation  I have personally reviewed:  The patient's medical and social history  Their use of alcohol, tobacco or illicit drugs  Their current medications and supplements  The patient's functional ability including ADLs,fall risks, home safety risks, cognitive, and hearing and  visual impairment  Diet and physical activities  Evidence for depression or mood disorders  The patient's weight, height, BMI, and visual acuity have been recorded in the chart. I have made referrals, counseling, and provided education to the patient based on review of the above and I have provided the patient with a written personalized care plan for preventive services.

## 2015-01-04 NOTE — Patient Instructions (Signed)
Take 2000 units vitamin D 3 daily. Continue to watch diet and exercise. Return in 6 months.

## 2015-03-23 ENCOUNTER — Other Ambulatory Visit: Payer: Self-pay | Admitting: Internal Medicine

## 2015-03-31 ENCOUNTER — Ambulatory Visit: Payer: Medicare Other | Admitting: Internal Medicine

## 2015-04-01 ENCOUNTER — Ambulatory Visit (INDEPENDENT_AMBULATORY_CARE_PROVIDER_SITE_OTHER): Payer: Medicare Other | Admitting: Internal Medicine

## 2015-04-01 ENCOUNTER — Encounter: Payer: Self-pay | Admitting: Internal Medicine

## 2015-04-01 VITALS — BP 124/70 | HR 64 | Temp 97.3°F | Resp 18 | Ht 73.0 in | Wt 190.0 lb

## 2015-04-01 DIAGNOSIS — H6502 Acute serous otitis media, left ear: Secondary | ICD-10-CM

## 2015-04-01 DIAGNOSIS — K648 Other hemorrhoids: Secondary | ICD-10-CM

## 2015-04-01 DIAGNOSIS — K644 Residual hemorrhoidal skin tags: Secondary | ICD-10-CM

## 2015-04-01 DIAGNOSIS — K649 Unspecified hemorrhoids: Secondary | ICD-10-CM

## 2015-04-01 MED ORDER — HYDROCORTISONE ACETATE 25 MG RE SUPP: 25.0000 mg | Freq: Two times a day (BID) | RECTAL | Status: DC

## 2015-04-01 NOTE — Patient Instructions (Signed)
Anusol HC rectal suppositories twice daily for 6 days. Appointment with general surgeon to discuss hemorrhoidectomy.

## 2015-04-01 NOTE — Progress Notes (Signed)
   Subjective:    Patient ID: Jeffrey Wu., male    DOB: 1945-08-20, 70 y.o.   MRN: [id number]  HPI  Here today with hemorrhoids but also has severeLeft ear pain. Recently on antibiotics for patella  infection at [facility]. Had contusion and possible cellulitis after a fall on right knee. Thinks he was probably treated with doxycycline. This seemed to cause some constipation. He had to digitally disimpact himself. Says hemorrhoids were swollen and inflamed. Long-standing history of hemorrhoids. He cannot recall date of last colonoscopy because it was done in [place] prior to his moving to [place]. He is to call Dr. Claude Manges in [place],His previous primary care physician, to find out when he last had a colonoscopy and when his next one is due.  He has chronic ear issues and allergic rhinitis with recurrent sinusitis treated at [facility]. He awakened today with severe left ear pain. He is on a daily dose of steroids for these issues.  At one point the rectal  bleeding reportedly from hemorrhoid got very severe. Seems to be worse with prolonged trips and traveling.  Review of Systems     Objective:   Physical Exam Rectal exam. External hemorrhoids noted. No stool to guaiac. Anoscopy showed enlarged internal hemorrhoids but no active bleeding at the present time.  Examination of left ear shows slight fullness was blade light reflex. No redness.       Assessment & Plan:  Bleeding hemorrhoids  Internal hemorrhoids  External hemorrhoids  Left otalgia  Allergic rhinitis  Plan: He will have appointment with general surgeon to discuss possibility of hemorrhoidectomy in the near future. Anusol HC rectal suppositories twice daily 6 days with refills. He increased his dose of prednisone from 10 mg to 30 mg daily and this seems to have helped his ear pain today. He may have eustachian tube dysfunction that responded to increased dose of  steroids. Did not prescribe antibiotics today.

## 2015-04-02 ENCOUNTER — Telehealth: Payer: Self-pay | Admitting: Internal Medicine

## 2015-04-02 NOTE — Telephone Encounter (Signed)
Fax from Dr. Rae Halsted office; appointment on 04/11/15 @ 11:00.  Dx:  Bleeding hemorrhoids.    Patient notified.

## 2015-04-11 ENCOUNTER — Other Ambulatory Visit: Payer: Self-pay | Admitting: Surgery

## 2015-06-03 ENCOUNTER — Other Ambulatory Visit: Payer: Self-pay | Admitting: Internal Medicine

## 2015-06-13 ENCOUNTER — Other Ambulatory Visit: Payer: Self-pay | Admitting: Internal Medicine

## 2015-06-13 ENCOUNTER — Other Ambulatory Visit: Payer: Medicare Other | Admitting: Internal Medicine

## 2015-06-13 DIAGNOSIS — E119 Type 2 diabetes mellitus without complications: Secondary | ICD-10-CM

## 2015-06-13 DIAGNOSIS — Z79899 Other long term (current) drug therapy: Secondary | ICD-10-CM

## 2015-06-13 DIAGNOSIS — E785 Hyperlipidemia, unspecified: Secondary | ICD-10-CM

## 2015-06-13 LAB — LIPID PANEL
CHOLESTEROL: 209 mg/dL — AB (ref 125–200)
HDL: 89 mg/dL (ref >=40)
LDL CALC: 101 mg/dL (ref <130)
TRIGLYCERIDES: 93 mg/dL (ref <150)
Total CHOL/HDL Ratio: 2.3 Ratio (ref <=5.0)
VLDL: 19 mg/dL (ref <30)

## 2015-06-13 LAB — HEPATIC FUNCTION PANEL
ALBUMIN: 3.4 g/dL — AB (ref 3.6–5.1)
ALT: 22 U/L (ref 9–46)
AST: 25 U/L (ref 10–35)
Alkaline Phosphatase: 69 U/L (ref 40–115)
BILIRUBIN DIRECT: 0.1 mg/dL (ref <=0.2)
BILIRUBIN TOTAL: 0.4 mg/dL (ref 0.2–1.2)
Indirect Bilirubin: 0.3 mg/dL (ref 0.2–1.2)
TOTAL PROTEIN: 6.2 g/dL (ref 6.1–8.1)

## 2015-06-13 LAB — HEMOGLOBIN A1C
HEMOGLOBIN A1C: 6.3 % — AB (ref <5.7)
Mean Plasma Glucose: 134 mg/dL

## 2015-06-16 ENCOUNTER — Ambulatory Visit (INDEPENDENT_AMBULATORY_CARE_PROVIDER_SITE_OTHER): Payer: Medicare Other | Admitting: Internal Medicine

## 2015-06-16 ENCOUNTER — Encounter: Payer: Self-pay | Admitting: Internal Medicine

## 2015-06-16 VITALS — BP 124/78 | HR 61 | Temp 97.8°F | Resp 18 | Ht 73.0 in | Wt 186.5 lb

## 2015-06-16 DIAGNOSIS — D692 Other nonthrombocytopenic purpura: Secondary | ICD-10-CM

## 2015-06-16 DIAGNOSIS — E559 Vitamin D deficiency, unspecified: Secondary | ICD-10-CM | POA: Diagnosis not present

## 2015-06-16 DIAGNOSIS — Z92241 Personal history of systemic steroid therapy: Secondary | ICD-10-CM

## 2015-06-16 DIAGNOSIS — E785 Hyperlipidemia, unspecified: Secondary | ICD-10-CM

## 2015-06-16 DIAGNOSIS — R7302 Impaired glucose tolerance (oral): Secondary | ICD-10-CM | POA: Diagnosis not present

## 2015-06-16 LAB — VITAMIN D 25 HYDROXY (VIT D DEFICIENCY, FRACTURES): VIT D 25 HYDROXY: 31 ng/mL (ref 30–100)

## 2015-06-16 NOTE — Patient Instructions (Signed)
Continue diet and exercise efforts. Return in 6 months for physical exam. It was a pleasure to see you today. Vitamin D level to be added to recent lab work. Continue vitamin D supplement. Have bone density study.

## 2015-06-16 NOTE — Progress Notes (Signed)
   Subjective:    Patient ID: Jeffrey Feathers., male    DOB: 09-15-1945, 70 y.o.   MRN: [id number]  HPI 70 year old Male in today for six-month follow-up on impaired glucose tolerance and hyperlipidemia. He's noticed more purpura recently. He is on 2 aspirin daily per allergist at [facility] and also takes prednisone for chronic sinusitis issues. Combination of these medications is likely causing the purpura he's experiencing on his arms. He's noticed some skin thinning as well which is likely related to prednisone. Otherwise he feels well. Realizes he's been not following Weight Watchers recently. Hemoglobin A1c has increased from 6-6.3%. Liver functions are within normal limits. Total cholesterol has increased from 196-209. LDL is normal.    Review of Systems     Objective:   Physical Exam  Constitutional: He appears well-developed and well-nourished. No distress.  Neck: Neck supple. No JVD present. No thyromegaly present.  Cardiovascular: Normal rate, regular rhythm and normal heart sounds.   No murmur heard. Pulmonary/Chest: Effort normal and breath sounds normal. No respiratory distress. He has no wheezes.  Musculoskeletal: He exhibits no edema.  Lymphadenopathy:    He has no cervical adenopathy.  Skin: He is not diaphoretic.  Purpura on arms  Psychiatric: He has a normal mood and affect. His behavior is normal. Judgment and thought content normal.  Vitals reviewed.         Assessment & Plan:  History of vitamin D deficiency-repeat vitamin D level. Has been taking 2000 units daily. I do not feel he needs to take calcium supplement. Suggest bone density study. Order given.  Hyperlipidemia-continue diet and exercise efforts  Impaired glucose tolerance-continue diet and exercise efforts her graft plan: Return in 6 months for physical exam  Purpura-related to prednisone and aspirin therapy. He says consultants feel he needs to do both.

## 2015-07-04 ENCOUNTER — Ambulatory Visit
Admission: RE | Admit: 2015-07-04 | Discharge: 2015-07-04 | Disposition: A | Discharge status: Home / Self Care | Payer: Medicare Other | Source: Ambulatory Visit | Attending: Internal Medicine | Admitting: Internal Medicine

## 2015-07-04 DIAGNOSIS — Z92241 Personal history of systemic steroid therapy: Secondary | ICD-10-CM

## 2015-07-07 ENCOUNTER — Other Ambulatory Visit: Payer: Self-pay | Admitting: Internal Medicine

## 2015-07-07 NOTE — Telephone Encounter (Signed)
Verbal order by Dr. Renold Genta; ok to refill Hydrocortisone AC 25 mg suppository, #12, 3 refills.  Left voicemail for [facility] @ [phone].

## 2015-08-04 ENCOUNTER — Other Ambulatory Visit: Payer: Self-pay

## 2015-08-04 MED ORDER — DUTASTERIDE-TAMSULOSIN HCL 0.5-0.4 MG PO CAPS: 1.0000 | ORAL_CAPSULE | Freq: Every day | ORAL | 0 refills | Status: DC

## 2015-08-05 ENCOUNTER — Other Ambulatory Visit: Payer: Self-pay

## 2015-08-05 MED ORDER — DUTASTERIDE-TAMSULOSIN HCL 0.5-0.4 MG PO CAPS: 1.0000 | ORAL_CAPSULE | Freq: Every day | ORAL | 3 refills | Status: DC

## 2015-09-09 ENCOUNTER — Ambulatory Visit (INDEPENDENT_AMBULATORY_CARE_PROVIDER_SITE_OTHER): Payer: Medicare Other | Admitting: Internal Medicine

## 2015-09-09 ENCOUNTER — Encounter: Payer: Self-pay | Admitting: Internal Medicine

## 2015-09-09 VITALS — BP 98/60 | HR 56 | Temp 97.7°F | Ht 73.0 in | Wt 191.0 lb

## 2015-09-09 DIAGNOSIS — J309 Allergic rhinitis, unspecified: Secondary | ICD-10-CM | POA: Diagnosis not present

## 2015-09-09 DIAGNOSIS — I951 Orthostatic hypotension: Secondary | ICD-10-CM | POA: Diagnosis not present

## 2015-09-09 DIAGNOSIS — N400 Enlarged prostate without lower urinary tract symptoms: Secondary | ICD-10-CM | POA: Diagnosis not present

## 2015-09-09 DIAGNOSIS — E785 Hyperlipidemia, unspecified: Secondary | ICD-10-CM | POA: Diagnosis not present

## 2015-10-03 NOTE — Patient Instructions (Addendum)
Stay well hydrated to combat orthostasis. Need to see if BPH medication can be adjusted or changed. Take a minute to get your balance when standing up.

## 2015-10-03 NOTE — Progress Notes (Signed)
   Subjective:    Patient ID: Jeffrey Wu., male    DOB: [birth year], 70 y.o.   MRN: [id number]  HPI 70 year old male in today complaining of dizziness. Says it is noticeable when he is standing up too fast. He does have orthostasis today in the exam room.  Blood pressure lying is 114/68 with pulse of 58. Blood pressure sitting is 110/64 with pulse of 57. Blood pressure standing is 98/60 with pulse of 56. O2 sat is 97%.  Recently was on vacation and had an episode of vertigo and fell causing a left knee abrasion. He is on combination Medication for BPH called Jalyn. This could well be the cause of his orthostasis. He is also on Paxil which potentially could call some orthostasis. He is on 7.5 mg of prednisone daily since June. History of allergic rhinitis treated at [facility]. He also takes Singulair and uses a steroid nasal spray. He has GE reflux and takes a PPI. History of hyperlipidemia treated with Crestor.  At first I thought he was describing benign positional vertigo but it is clear he has orthostasis.    Review of Systems     Objective:   Physical Exam PERRLA. Funduscopic exam is benign. Extraocular movements are full. TMs are clear. Pharynx is clear. Neck is supple without JVD thyromegaly or carotid bruits.  Detailed neurological exam shows no focal deficits. His gait is normal today. Cerebellar finger to nose testing is within normal limits. Deep tendon reflexes 2+ and symmetrical. Muscle strength is normal. Cranial nerves II through XII grossly intact.       Assessment & Plan:  Orthostatic hypotension likely secondary to medication  Vertigo-this can be treated with meclizine but I'm concerned about his orthostasis.  Plan:He is to stay well hydrated as much as possible during the hot weather. He should consult with urologist as to whether or not Jalyn can be changed. When he stands up he should take some time before trying to walk. This may  prevent falling.

## 2015-10-31 ENCOUNTER — Encounter: Payer: Self-pay | Admitting: Internal Medicine

## 2015-10-31 ENCOUNTER — Ambulatory Visit (INDEPENDENT_AMBULATORY_CARE_PROVIDER_SITE_OTHER): Payer: Medicare Other | Admitting: Internal Medicine

## 2015-10-31 VITALS — BP 120/80 | HR 56 | Temp 97.3°F | Wt 190.0 lb

## 2015-10-31 DIAGNOSIS — G4700 Insomnia, unspecified: Secondary | ICD-10-CM

## 2015-10-31 DIAGNOSIS — R55 Syncope and collapse: Secondary | ICD-10-CM | POA: Diagnosis not present

## 2015-10-31 DIAGNOSIS — G4762 Sleep related leg cramps: Secondary | ICD-10-CM

## 2015-10-31 DIAGNOSIS — I951 Orthostatic hypotension: Secondary | ICD-10-CM

## 2015-10-31 DIAGNOSIS — Z23 Encounter for immunization: Secondary | ICD-10-CM | POA: Diagnosis not present

## 2015-10-31 NOTE — Patient Instructions (Signed)
Flu vaccine given today. Magnesium supplement over-the-counter for leg cramps. Return in 6 months

## 2015-10-31 NOTE — Progress Notes (Signed)
   Subjective:    Patient ID: Braxton Feathers., male    DOB: 12-28-1945, 70 y.o.   MRN: [id number]  HPI At last visit we discussed near syncopal episode he  had. We determined he was orthostatic. We thought it might be due to his prostate medication or perhaps Paxil. We elected to taper her Paxil which she's done over the past few weeks without any difficulty whatsoever. He has had no more symptoms of near syncope and feels well.    Review of Systems issues with cramps in legs and arms. Sometimes wakes up and cannot go back to sleep easily.        Objective:   Physical Exam Orthostatic blood pressures taken today blood pressure lying 120/80, standing blood pressure 120/80, sitting blood pressure 124/80. Pulse lying 56, pulse standing 58 pulse sitting 64.         Assessment & Plan:  Near syncope-seems to have resolved since stopping Paxil  Nocturnal leg cramps-recommend magnesium supplement over-the-counter  Insomnia we discussed this at length. He doesn't want to take any medication for it  Plan: He'll return in February for physical examination. He is planning a trip to [place] this coming spring with his wife.

## 2016-01-01 ENCOUNTER — Ambulatory Visit: Payer: Medicare Other | Admitting: Internal Medicine

## 2016-01-05 HISTORY — PX: COLONOSCOPY: SHX174

## 2016-01-26 ENCOUNTER — Encounter: Payer: Self-pay | Admitting: Internal Medicine

## 2016-01-26 ENCOUNTER — Ambulatory Visit (INDEPENDENT_AMBULATORY_CARE_PROVIDER_SITE_OTHER): Payer: Medicare Other | Admitting: Internal Medicine

## 2016-01-26 VITALS — BP 130/82 | HR 60 | Wt 194.0 lb

## 2016-01-26 DIAGNOSIS — H6692 Otitis media, unspecified, left ear: Secondary | ICD-10-CM | POA: Diagnosis not present

## 2016-01-26 DIAGNOSIS — J329 Chronic sinusitis, unspecified: Secondary | ICD-10-CM

## 2016-01-26 MED ORDER — DOXYCYCLINE HYCLATE 100 MG PO TABS: 100.0000 mg | ORAL_TABLET | Freq: Two times a day (BID) | ORAL | 0 refills | Status: DC

## 2016-01-26 NOTE — Patient Instructions (Signed)
Doxycycline 100 mg twice daily for 10 days. Continue prednisone as prescribed by Dr. Vicente Masson.

## 2016-01-26 NOTE — Progress Notes (Signed)
   Subjective:    Patient ID: Jeffrey Wu., male    DOB: 1945-07-09, 71 y.o.   MRN: [id number]  HPI  Was traveling recently in [place] and saw physician there who prescribed a Z pak. CXR there was negative. Sinus symptoms have not completely cleared up. Still has discolored nasal drainage. Taking prednisone 5 mg bid chronically for recurrent sinusitis. Followed by Dr. Vicente Masson at [facility]. Review from [facility] met his [facility] reveals no recent antibiotic treatment. No fever. No myalgias or chills.    Review of Systems complaining of left ear pain and discolored nasal drainage     Objective:   Physical Exam Skin warm and dry. Nodes none. Right TM is obscured by cerumen. This was not removed. Left TM is red and dull and retracted. He sounds nasally congested. Neck is supple. Chest clear to auscultation without rales or wheezing       Assessment & Plan:   Acute maxillary sinusitis  Acute left otitis media  Plan: Doxycycline 100 mg twice daily for 10 days.

## 2016-02-16 ENCOUNTER — Other Ambulatory Visit: Payer: Medicare Other | Admitting: Internal Medicine

## 2016-02-16 DIAGNOSIS — E785 Hyperlipidemia, unspecified: Secondary | ICD-10-CM

## 2016-02-16 DIAGNOSIS — Z0000 Encounter for general adult medical examination without abnormal findings: Secondary | ICD-10-CM

## 2016-02-16 DIAGNOSIS — N400 Enlarged prostate without lower urinary tract symptoms: Secondary | ICD-10-CM

## 2016-02-16 DIAGNOSIS — E119 Type 2 diabetes mellitus without complications: Secondary | ICD-10-CM

## 2016-02-16 DIAGNOSIS — Z125 Encounter for screening for malignant neoplasm of prostate: Secondary | ICD-10-CM

## 2016-02-16 LAB — CBC WITH DIFFERENTIAL/PLATELET
Basophils Absolute: 0 cells/uL (ref 0–200)
Basophils Relative: 0 %
EOS ABS: 292 {cells}/uL (ref 15–500)
Eosinophils Relative: 4 %
HEMATOCRIT: 43.5 % (ref 38.5–50.0)
HEMOGLOBIN: 14.5 g/dL (ref 13.2–17.1)
LYMPHS ABS: 1460 {cells}/uL (ref 850–3900)
Lymphocytes Relative: 20 %
MCH: 32.6 pg (ref 27.0–33.0)
MCHC: 33.3 g/dL (ref 32.0–36.0)
MCV: 97.8 fL (ref 80.0–100.0)
MONO ABS: 730 {cells}/uL (ref 200–950)
MPV: 10.7 fL (ref 7.5–12.5)
Monocytes Relative: 10 %
NEUTROS PCT: 66 %
Neutro Abs: 4818 cells/uL (ref 1500–7800)
Platelets: 168 10*3/uL (ref 140–400)
RBC: 4.45 MIL/uL (ref 4.20–5.80)
RDW: 13.1 % (ref 11.0–15.0)
WBC: 7.3 10*3/uL (ref 3.8–10.8)

## 2016-02-16 LAB — COMPREHENSIVE METABOLIC PANEL
ALBUMIN: 3.5 g/dL — AB (ref 3.6–5.1)
ALT: 27 U/L (ref 9–46)
AST: 24 U/L (ref 10–35)
Alkaline Phosphatase: 66 U/L (ref 40–115)
BILIRUBIN TOTAL: 0.6 mg/dL (ref 0.2–1.2)
BUN: 14 mg/dL (ref 7–25)
CALCIUM: 9 mg/dL (ref 8.6–10.3)
CO2: 30 mmol/L (ref 20–31)
Chloride: 101 mmol/L (ref 98–110)
Creat: 0.91 mg/dL (ref 0.70–1.18)
GLUCOSE: 96 mg/dL (ref 65–99)
POTASSIUM: 4.1 mmol/L (ref 3.5–5.3)
Sodium: 138 mmol/L (ref 135–146)
Total Protein: 5.9 g/dL — ABNORMAL LOW (ref 6.1–8.1)

## 2016-02-16 LAB — LIPID PANEL
CHOL/HDL RATIO: 2.1 ratio (ref <5.0)
Cholesterol: 208 mg/dL — ABNORMAL HIGH (ref <200)
HDL: 97 mg/dL (ref >40)
LDL Cholesterol: 97 mg/dL (ref <100)
TRIGLYCERIDES: 70 mg/dL (ref <150)
VLDL: 14 mg/dL (ref <30)

## 2016-02-16 LAB — PSA: PSA: 0.3 ng/mL (ref <=4.0)

## 2016-02-16 LAB — HEMOGLOBIN A1C
Hgb A1c MFr Bld: 5.9 % — ABNORMAL HIGH (ref <5.7)
Mean Plasma Glucose: 123 mg/dL

## 2016-02-18 LAB — MICROALBUMIN / CREATININE URINE RATIO

## 2016-02-19 ENCOUNTER — Encounter: Payer: Self-pay | Admitting: Internal Medicine

## 2016-02-19 ENCOUNTER — Ambulatory Visit (INDEPENDENT_AMBULATORY_CARE_PROVIDER_SITE_OTHER): Payer: Medicare Other | Admitting: Internal Medicine

## 2016-02-19 VITALS — BP 120/70 | HR 63 | Temp 97.2°F | Ht 71.25 in | Wt 193.0 lb

## 2016-02-19 DIAGNOSIS — N400 Enlarged prostate without lower urinary tract symptoms: Secondary | ICD-10-CM

## 2016-02-19 DIAGNOSIS — J339 Nasal polyp, unspecified: Secondary | ICD-10-CM | POA: Diagnosis not present

## 2016-02-19 DIAGNOSIS — F32A Depression, unspecified: Secondary | ICD-10-CM

## 2016-02-19 DIAGNOSIS — J329 Chronic sinusitis, unspecified: Secondary | ICD-10-CM

## 2016-02-19 DIAGNOSIS — Z92241 Personal history of systemic steroid therapy: Secondary | ICD-10-CM

## 2016-02-19 DIAGNOSIS — Z0000 Encounter for general adult medical examination without abnormal findings: Secondary | ICD-10-CM

## 2016-02-19 DIAGNOSIS — E559 Vitamin D deficiency, unspecified: Secondary | ICD-10-CM | POA: Diagnosis not present

## 2016-02-19 DIAGNOSIS — D692 Other nonthrombocytopenic purpura: Secondary | ICD-10-CM | POA: Insufficient documentation

## 2016-02-19 DIAGNOSIS — E785 Hyperlipidemia, unspecified: Secondary | ICD-10-CM

## 2016-02-19 DIAGNOSIS — F329 Major depressive disorder, single episode, unspecified: Secondary | ICD-10-CM

## 2016-02-19 DIAGNOSIS — G609 Hereditary and idiopathic neuropathy, unspecified: Secondary | ICD-10-CM

## 2016-02-19 DIAGNOSIS — R7302 Impaired glucose tolerance (oral): Secondary | ICD-10-CM

## 2016-02-19 MED ORDER — OMEPRAZOLE 40 MG PO CPDR: 40.0000 mg | DELAYED_RELEASE_CAPSULE | Freq: Every day | ORAL | 3 refills | Status: DC

## 2016-02-19 MED ORDER — MONTELUKAST SODIUM 10 MG PO TABS: 10.0000 mg | ORAL_TABLET | Freq: Every evening | ORAL | 3 refills | Status: AC

## 2016-02-19 MED ORDER — DUTASTERIDE-TAMSULOSIN HCL 0.5-0.4 MG PO CAPS: 1.0000 | ORAL_CAPSULE | Freq: Every day | ORAL | 3 refills | Status: DC

## 2016-02-19 MED ORDER — MOMETASONE FURO-FORMOTEROL FUM 100-5 MCG/ACT IN AERO: INHALATION_SPRAY | RESPIRATORY_TRACT | Status: DC

## 2016-02-19 MED ORDER — DOXYCYCLINE HYCLATE 100 MG PO TABS: 100.0000 mg | ORAL_TABLET | Freq: Two times a day (BID) | ORAL | 0 refills | Status: DC

## 2016-02-19 MED ORDER — MUPIROCIN 2 % EX OINT: TOPICAL_OINTMENT | CUTANEOUS | 0 refills | Status: DC

## 2016-02-19 MED ORDER — ROSUVASTATIN CALCIUM 10 MG PO TABS: 10.0000 mg | ORAL_TABLET | Freq: Every day | ORAL | 3 refills | Status: DC

## 2016-02-19 NOTE — Progress Notes (Signed)
Subjective:    Patient ID: Jeffrey Wu., male    DOB: [birth year], 71 y.o.   MRN: [id number]  HPI  71 year old Male for [facility] and evaluation of medical issues. He has a history of recurrent sinusitis and allergic rhinitis. He's treated for this at [facility] and is on chronic prednisone therapy daily. He had aspirin de- sensitivity procedure done there as well. History of sleep apnea and uses C Pap machine. History of BPH and hyperlipidemia. History of depression.  Past medical history: Appendectomy 1955. Thyroglossal duct cyst removed in 1990. Hernia repair 2009.  Social history: He is married. 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 Quit smoking several years ago but formerly smoked a pack of cigarettes daily for some 46 years. Consumes 3 ounces of alcohol daily.  Family history: Father died at age 69 of leukemia. Patient was 71 years old at the time. Mother died at age 27 of a stroke after heart surgery. One brother and one sister in good health. 3 adult children-2 sons and a daughter all of whom are in good health.    Review of Systems no new concerns     Objective:   Physical Exam  Constitutional: He is oriented to person, place, and time. He appears well-developed and well-nourished. No distress.  HENT:  Head: Normocephalic and atraumatic.  Right Ear: External ear normal.  Left Ear: External ear normal.  Mouth/Throat: Oropharynx is clear and moist.  Eyes: Conjunctivae and EOM are normal. Pupils are equal, round, and reactive to light. Left eye exhibits no discharge. No scleral icterus.  Neck: Neck supple. No JVD present. No thyromegaly present.  Cardiovascular: Normal rate and normal heart sounds.   No murmur heard. Pulmonary/Chest: Effort normal and breath sounds normal. No respiratory distress. He has no wheezes. He has no rales.  Abdominal: Soft. Bowel sounds are normal. He  exhibits no distension and no mass. There is no tenderness. There is no rebound and no guarding.  Genitourinary: Prostate normal.  Musculoskeletal: He exhibits no edema.  Lymphadenopathy:    He has no cervical adenopathy.  Neurological: He is alert and oriented to person, place, and time. He has normal reflexes.  Skin: Skin is warm and dry. No rash noted. He is not diaphoretic.  Psychiatric: He has a normal mood and affect. His behavior is normal. Judgment and thought content normal.  Vitals reviewed.         Assessment & Plan:  Sleep apnea-treated with C-pap  History of vitamin D deficiency  History of recurrent sinusitis  Impaired glucose tolerance- hemoglobin A1c is 5.9%  History of aspirin sensitivity status post desensitization procedure  Chronic prednisone therapy  BPH  History of depression  Peripheral neuropathy  Chronic allergic rhinitis  Hyperlipidemia treated with Crestor. Recent lipid panel is within normal limits with exception of a mild elevation of total cholesterol 208 but he has a high HDL cholesterol and LDL was normal.  Senile purpura due to prednisone therapy  GE reflux treated with Prilosec  Plan: Return in 6 months or as needed. Continue same medications.  Subjective:   Patient presents for Medicare Annual/Subsequent preventive examination.  Review Past Medical/Family/Social:See above   Risk Factors likes hunting and fishing Current exercise habits: Active outdoors Dietary issues discussed: Low fat low carbohydrate  Cardiac risk factors:Hyperlipidemia,Stroke in mother  Depression Screen  (Note: if answer to either of the following is "Yes",  a more complete depression screening is indicated)   Over the past two weeks, have you felt down, depressed or hopeless? No  Over the past two weeks, have you felt little interest or pleasure in doing things? No Have you lost interest or pleasure in daily life? No Do you often feel hopeless?  No Do you cry easily over simple problems? No   Activities of Daily Living  In your present state of health, do you have any difficulty performing the following activities?:   Driving? No  Managing money? No  Feeding yourself? No  Getting from bed to chair? No  Climbing a flight of stairs? No  Preparing food and eating?: No  Bathing or showering? No  Getting dressed: No  Getting to the toilet? No  Using the toilet:No  Moving around from place to place: No  In the past year have you fallen or had a near fall?:Yes near syncopal episode related to medication Are you sexually active? No  Do you have more than one partner? No   Hearing Difficulties: No  Do you often ask people to speak up or repeat themselves? Yes Do you experience ringing or noises in your ears? Yes Do you have difficulty understanding soft or whispered voices? Yes Do you feel that you have a problem with memory? No Do you often misplace items? No    Home Safety:  Do you have a smoke alarm at your residence? Yes Do you have grab bars in the bathroom?Yes Do you have throw rugs in your house? No   Cognitive Testing  Alert? Yes Normal Appearance?Yes  Oriented to person? Yes Place? Yes  Time? Yes  Recall of three objects? Yes  Can perform simple calculations? Yes  Displays appropriate judgment?Yes  Can read the correct time from a watch face?Yes   List the Names of Other Physician/Practitioners you currently use:  See referral list for the physicians patient is currently seeing.  [facility] allergist   Review of Systems: See above   Objective:     General appearance: Appears Younger than stated age Head: Normocephalic, without obvious abnormality, atraumatic  Eyes: conj clear, EOMi PEERLA  Ears: normal TM's and external ear canals both ears  Nose: Nares normal. Septum midline. Mucosa normal. No drainage or sinus tenderness.  Throat: lips, mucosa, and tongue normal; teeth and gums normal  Neck: no  adenopathy, no carotid bruit, no JVD, supple, symmetrical, trachea midline and thyroid not enlarged, symmetric, no tenderness/mass/nodules  No CVA tenderness.  Lungs: clear to auscultation bilaterally  Breasts: normal appearance, no masses or tenderness Heart: regular rate and rhythm, S1, S2 normal, no murmur, click, rub or gallop  Abdomen: soft, non-tender; bowel sounds normal; no masses, no organomegaly  Musculoskeletal: ROM normal in all joints, no crepitus, no deformity, Normal muscle strengthen. Back  is symmetric, no curvature. Skin: Skin color, texture, turgor normal. No rashes or lesions  Lymph nodes: Cervical, supraclavicular, and axillary nodes normal.  Neurologic: CN 2 -12 Normal, Normal symmetric reflexes. Normal coordination and gait  Psych: Alert & Oriented x 3, Mood appear stable.    Assessment:    Annual wellness medicare exam   Plan:    During the course of the visit the patient was educated and counseled about appropriate screening and preventive services including:   Annual flu vaccine     Patient Instructions (the written plan) was given to the patient.  Medicare Attestation  I have personally reviewed:  The patient's medical and social history  Their use of alcohol, tobacco or illicit drugs  Their current medications and supplements  The patient's functional ability including ADLs,fall risks, home safety risks, cognitive, and hearing and visual impairment  Diet and physical activities  Evidence for depression or mood disorders  The patient's weight, height, BMI, and visual acuity have been recorded in the chart. I have made referrals, counseling, and provided education to the patient based on review of the above and I have provided the patient with a written personalized care plan for preventive services.

## 2016-02-20 LAB — MICROALBUMIN / CREATININE URINE RATIO
Creatinine, Urine: 80 mg/dL (ref 20–370)
Microalb Creat Ratio: 3 mcg/mg creat (ref <30)
Microalb, Ur: 0.2 mg/dL

## 2016-02-29 NOTE — Patient Instructions (Signed)
It was a pleasure to see you today.  Continue same medications and return in 6 months. 

## 2016-03-11 ENCOUNTER — Ambulatory Visit (INDEPENDENT_AMBULATORY_CARE_PROVIDER_SITE_OTHER): Payer: Medicare Other | Admitting: Internal Medicine

## 2016-03-11 VITALS — BP 110/60 | HR 67 | Temp 97.2°F | Wt 192.0 lb

## 2016-03-11 DIAGNOSIS — F339 Major depressive disorder, recurrent, unspecified: Secondary | ICD-10-CM | POA: Diagnosis not present

## 2016-03-12 ENCOUNTER — Encounter: Payer: Self-pay | Admitting: Internal Medicine

## 2016-03-12 ENCOUNTER — Other Ambulatory Visit: Payer: Self-pay | Admitting: Internal Medicine

## 2016-03-12 MED ORDER — PAROXETINE HCL 10 MG PO TABS: ORAL_TABLET | ORAL | 0 refills | Status: DC

## 2016-03-12 NOTE — Progress Notes (Signed)
   Subjective:    Patient ID: Jeffrey Wu., male    DOB: [birth year], 71 y.o.   MRN: [id number]  HPI 71 year old Male with long-standing history of depression. Previously had been on Paxil 10 mg daily for many years probably since the early 1990s.  In the fall, he had had a syncopal episode which seemed to be related to dizziness from orthostatic hypotension. He was found to have orthostasis on exam 09/09/2015. Blood pressure dropped from 114/68 lying to 98/60 standing. Paxil was tapered and discontinued because orthostasis was a known side effect of this medication.  Since that time, patient has become more irritable. He did not enjoy keeping his grandson recently. His wife has noticed his irritability. He has been down and is a little tearful in the office today. We had a long talk about his childhood. He denies suicidal ideation.    Review of Systems see above     Objective:   Physical Exam Patient not examined today but thorough discussion regarding depression symptoms held for 20 minutes. Explanation of serotonin depletion causing symptoms.       Assessment & Plan:  Depression  Plan: Restart Paxil at 5 mg instead of 10 mg daily. Patient will let me know if symptoms are not improved in the next 2 weeks.

## 2016-03-12 NOTE — Telephone Encounter (Signed)
Call pharmacy. I changed dose from 10 to 5 mg yesterday.

## 2016-03-12 NOTE — Patient Instructions (Signed)
Paxil 5 mg daily. Call if symptoms not improved in 2 weeks.

## 2016-04-09 ENCOUNTER — Other Ambulatory Visit: Payer: Self-pay | Admitting: Internal Medicine

## 2016-06-10 ENCOUNTER — Ambulatory Visit (INDEPENDENT_AMBULATORY_CARE_PROVIDER_SITE_OTHER): Payer: Medicare Other | Admitting: Internal Medicine

## 2016-06-10 ENCOUNTER — Encounter: Payer: Self-pay | Admitting: Internal Medicine

## 2016-06-10 VITALS — BP 104/62 | HR 64 | Temp 97.7°F | Wt 194.0 lb

## 2016-06-10 DIAGNOSIS — K649 Unspecified hemorrhoids: Secondary | ICD-10-CM | POA: Diagnosis not present

## 2016-06-10 DIAGNOSIS — G4733 Obstructive sleep apnea (adult) (pediatric): Secondary | ICD-10-CM

## 2016-06-10 DIAGNOSIS — Z1211 Encounter for screening for malignant neoplasm of colon: Secondary | ICD-10-CM | POA: Diagnosis not present

## 2016-06-10 DIAGNOSIS — R1310 Dysphagia, unspecified: Secondary | ICD-10-CM | POA: Diagnosis not present

## 2016-06-10 DIAGNOSIS — K219 Gastro-esophageal reflux disease without esophagitis: Secondary | ICD-10-CM | POA: Diagnosis not present

## 2016-06-10 DIAGNOSIS — F339 Major depressive disorder, recurrent, unspecified: Secondary | ICD-10-CM

## 2016-06-10 MED ORDER — PAROXETINE HCL 10 MG PO TABS: 5.0000 mg | ORAL_TABLET | Freq: Every day | ORAL | 1 refills | Status: DC

## 2016-06-10 NOTE — Progress Notes (Signed)
   Subjective:    Patient ID: Jeffrey Wu., male    DOB: 1945/03/05, 71 y.o.   MRN: [id number]  HPI  71 year old Male had hemorrhoids banded recently by Dr. Nedra Hai.Says he cannot find his colonoscopy records from [place]. Would like to get one in the near future.  Also having issues with food not wanting to go down smoothly. Gets choked easily. It may be presbyesophagus,stricture or another issue. He will discuss with Gastroenterologist. Burnis Medin make GI referral.  He was restarted on Paxil 5 mg daily several months ago but apparently never picked it up. He's been in [place] traveling. Explained to him could not get insurance company to approve it and he can simply pay out of pocket for generic Paxil 5 mg daily. He's asking if there something else he should try, however Paxil worked well for him for  many years. Says he sleeps some 4-6 hours a night which I think is just fine. I do not think he needs a sleep aid. He had a good time in [place] but came back tired and has not gotten into an exercise regimen.  We have a few records from his previous physician in [place], Dr. Claude Manges, but none of these records indicate a date of a colonoscopy in the past.    Patient filled out a patient history form here on 02/17/2010. At that time he indicated he thought he had a flexible sigmoidoscopy around 26 in [place].  History of obstructive sleep apnea with sleep apnea being prescribed after sleep study 2009 in [place].     Review of Systems see above     Objective:   Physical Exam  Not examined. Spent 25 minutes speaking with him about these issuesDocumented above      Assessment & Plan:  Depression-restart Paxil at 5 mg daily. Follow-up in August at time of regular six-month recheck  Need for colonoscopy  ? Presbyesophagus versus motility disorderOr stricture  Hemorrhoids-had banding by Dr. Ninfa Linden  Plan: His six-month recheck is due in August and we  will follow-up with him at that time.

## 2016-06-10 NOTE — Patient Instructions (Signed)
GI referral made. Start Paxil 5 mg daily and follow-up here in August.

## 2016-06-11 ENCOUNTER — Encounter: Payer: Self-pay | Admitting: Internal Medicine

## 2016-07-01 ENCOUNTER — Ambulatory Visit (INDEPENDENT_AMBULATORY_CARE_PROVIDER_SITE_OTHER): Payer: Medicare Other | Admitting: Physician Assistant

## 2016-07-01 ENCOUNTER — Telehealth: Payer: Self-pay

## 2016-07-01 ENCOUNTER — Encounter: Payer: Self-pay | Admitting: Physician Assistant

## 2016-07-01 VITALS — BP 121/67 | HR 60 | Temp 98.7°F | Resp 18 | Ht 71.25 in | Wt 193.0 lb

## 2016-07-01 DIAGNOSIS — S61409A Unspecified open wound of unspecified hand, initial encounter: Secondary | ICD-10-CM

## 2016-07-01 DIAGNOSIS — Z23 Encounter for immunization: Secondary | ICD-10-CM

## 2016-07-01 DIAGNOSIS — S61411A Laceration without foreign body of right hand, initial encounter: Secondary | ICD-10-CM

## 2016-07-01 NOTE — Patient Instructions (Addendum)
WOUND CARE Steristrips Most wounds generally take about one week to heal. While healing is taking place we recommend that you protect the area with a clean, dry dressing. It is recommended to keep the wound clean and dry for 5-7 days. Cover your wound with a plastic bag or gladwrap when showering. Please change your dressing if it becomes wet or soiled. If you experience any signs of infection - increase in pain, swelling, redness, smelly or thick discharge of the wound please return to have [place] check your wound. These steristrips will usually fall off by themselves. When portions of the edges no longer stick to your skin, you can trim it off with scissors.   Thank you for letting me participate in your health and well being.    IF you received an x-ray today, you will receive an invoice from [facility]. Please contact [facility] at [phone] with questions or concerns regarding your invoice.   IF you received labwork today, you will receive an invoice from [facility]. Please contact [facility] at [phone] with questions or concerns regarding your invoice.   Our billing staff will not be able to assist you with questions regarding bills from these companies.  You will be contacted with the lab results as soon as they are available. The fastest way to get your results is to activate your My Chart account. Instructions are located on the last page of this paperwork. If you have not heard from [place] regarding the results in 2 weeks, please contact this office.

## 2016-07-01 NOTE — Progress Notes (Signed)
Braxton Feathers.  MRN: [id number] DOB: 1945-09-09  Subjective:  Jeffrey Wu. is a 71 y.o. male seen in office today for a chief complaint of laceration to right hand ~19 hours prior to arrival. Pt accidentally cut himself on a clean broken wine glass last night. Notes it immediately started bleeding. He applied a band aid, which successfully stopped the bleeding. He cleansed the wound with water only. Today, he denies excessive bleeding, pain, numbness, tingling, purulent drainage, and loss of ROM in right hand. His last Tdap vaccine was 04/13/2011. He takes ASA 650mg  daily.   Review of Systems  Constitutional: Negative for chills, diaphoresis, fatigue and fever.    Patient Active Problem List   Diagnosis Date Noted  . Senile purpura ([place]) 02/19/2016  . Impaired glucose tolerance 06/14/2014  . Unspecified hereditary and idiopathic peripheral neuropathy 06/03/2013  . Hyperlipidemia 09/27/2010  . OSA (obstructive sleep apnea) 09/27/2010  . Allergic rhinitis 09/27/2010  . BPH (benign prostatic hyperplasia) 09/27/2010    Current Outpatient Prescriptions on File Prior to Visit  Medication Sig Dispense Refill  . aspirin 325 MG tablet Take 325 mg by mouth 2 (two) times daily.     . Dutasteride-Tamsulosin HCl (JALYN) 0.5-0.4 MG CAPS Take 1 tablet by mouth daily. 90 capsule 3  . fexofenadine (ALLEGRA) 180 MG tablet Take 180 mg by mouth.    . hydrocortisone (ANUSOL-HC) 25 MG suppository PLACE 1 SUPPOSITORY (25 MG TOTAL) RECTALLY 2 (TWO) TIMES DAILY. 12 suppository prn  . mometasone-formoterol (DULERA) 100-5 MCG/ACT AERO Inhale 2 puffs in the morning, and 2 puffs in the evening.Inhale 2 puffs in the morning, and 2 puffs in the evening. 13 g pr  . montelukast (SINGULAIR) 10 MG tablet Take 1 tablet (10 mg total) by mouth every evening. 90 tablet 3  . omeprazole (PRILOSEC) 40 MG capsule Take 1 capsule (40 mg total) by mouth daily. 90 capsule 3  . PARoxetine (PAXIL) 10 MG tablet Take 0.5  tablets (5 mg total) by mouth daily. 45 tablet 1  . prednisoLONE acetate (PRED FORTE) 1 % ophthalmic suspension APPLY 3 DROPS TO EACH SIDE OF NOSE IN HAND HANGING POSITION TWICE A DAY. (OK FOR NASAL USE)  6  . predniSONE (DELTASONE) 5 MG tablet Take 5 mg by mouth daily with breakfast. Pt takes  10mg  some days    . rosuvastatin (CRESTOR) 10 MG tablet Take 1 tablet (10 mg total) by mouth daily. 90 tablet 3   No current facility-administered medications on file prior to visit.     Allergies  Allergen Reactions  . Aspirin Shortness Of Breath  . Ibuprofen Shortness Of Breath    Nausea and vomiting also      Social History   Social History  . Marital status: Married    Spouse name: Junious Dresser  . Number of children: Y  . Years of education: N/A   Occupational History  . retired.     Social History Main Topics  . Smoking status: Former Smoker    Packs/day: 1.00    Years: 50.00    Types: Cigarettes    Quit date: 01/05/2007  . Smokeless tobacco: Never Used  . Alcohol use Yes  . Drug use: No  . Sexual activity: Not on file   Other Topics Concern  . Not on file   Social History Narrative  . No narrative on file    Objective:  BP 121/67   Pulse 60   Temp 98.7 F (37.1 C) (Oral)  Resp 18   Ht 5' 11.25" (1.81 m)   Wt 193 lb (87.5 kg)   SpO2 93%   BMI 26.73 kg/m   Physical Exam  Constitutional: He is oriented to person, place, and time and well-developed, well-nourished, and in no distress.  HENT:  Head: Normocephalic and atraumatic.  Eyes: Conjunctivae are normal.  Neck: Normal range of motion.  Pulmonary/Chest: Effort normal.  Musculoskeletal:       Right hand: He exhibits laceration. He exhibits normal range of motion and normal capillary refill. Normal sensation noted. Normal strength noted.  Neurological: He is alert and oriented to person, place, and time. Gait normal.  Skin: Skin is warm and dry.     Psychiatric: Affect normal.  Vitals reviewed.  Wound care:    Verbal consent obtained from patient, who refused local anesthesia.  Wound explored for tendon, ligament damage. Wound scrubbed with soap and water and rinsed. Tincture of Benzoin applied. Steristrips applied to close wound.  Wound cleansed and dressed.  Assessment and Plan :  1. Laceration of right hand without foreign body, initial encounter Sutures not warranted as injury occurred >18 hours prior to being evaluated. Due to superficial nature of the wound, steri strips and wound dressing applied. Pt given wound care instructions both verbally and in AVS. Return as needed.  - Tdap vaccine greater than or equal to 7yo IM  Tenna Delaine, PA-C  [facility] 07/01/2016 4:34 PM

## 2016-07-01 NOTE — Telephone Encounter (Signed)
Spoke with Dr. Renold Genta she stated to send pt to UC at [facility] because of the possibilities of stitches. Pt is aware and is going there now.

## 2016-07-01 NOTE — Telephone Encounter (Signed)
Pt was drinking last night and dropped a bottle of wine, the glass from the bottle went into his hand. No glass still in the hand, it has stopped bleeding, about 1/8-1/4 inch deep puncture wound. Pt would like reccomendations on if he should come to the office or go to see if he may need stitches for the wound. Please advise.

## 2016-07-27 ENCOUNTER — Ambulatory Visit: Payer: Medicare Other | Admitting: Internal Medicine

## 2016-08-16 ENCOUNTER — Ambulatory Visit (INDEPENDENT_AMBULATORY_CARE_PROVIDER_SITE_OTHER): Payer: Medicare Other | Admitting: Internal Medicine

## 2016-08-16 ENCOUNTER — Encounter: Payer: Self-pay | Admitting: Internal Medicine

## 2016-08-16 VITALS — BP 116/60 | HR 72 | Ht 71.25 in | Wt 196.0 lb

## 2016-08-16 DIAGNOSIS — R1310 Dysphagia, unspecified: Secondary | ICD-10-CM

## 2016-08-16 DIAGNOSIS — Z1211 Encounter for screening for malignant neoplasm of colon: Secondary | ICD-10-CM

## 2016-08-16 MED ORDER — NA SULFATE-K SULFATE-MG SULF 17.5-3.13-1.6 GM/177ML PO SOLN: 1.0000 | Freq: Once | ORAL | 0 refills | Status: AC

## 2016-08-16 NOTE — Progress Notes (Signed)
HISTORY OF PRESENT ILLNESS:  Jeffrey Esty. is a pleasant 71 y.o. male recently relocated to [place] from [place] to be near his grandchildren. He is sent today by his primary care provider Dr. Renold Genta with chief complaints of swallowing problems and the need for colonoscopy. Patient states that he has had colonoscopies in the past. He is not aware that he has had polyps. Last examination possibly 10 years ago. He feels he is due for follow-up at this time. No outside records. No family history of colon cancer. Lower GI review of systems is negative except for hemorrhoids. Next, he reports a several year history of problems with choking or coughing after swallowing liquids. As well, he thinks he may have acid reflux or regurgitation with similar symptoms. This may occur 2 or 3 times per day. He does not describe what sounds like classic esophageal dysphagia. He is on daily PPI. He states he was placed on this because he is on chronic aspirin therapy as part of an aspirin desensitization program. He tells me that he denies classic reflux symptoms prior to being placed on PPI. He has not had prior upper endoscopy. He has been seen recently by general surgery Dr. Rush Farmer regarding hemorrhoids post-banding and right inguinal discomfort without evidence of recurrent hernia. That last office evaluation 07/06/2016 has been reviewed. Last seen by Dr. Renold Genta in early June. Blood work from February 2018 was unremarkable.  REVIEW OF SYSTEMS:  All non-GI ROS unless otherwise stated in the history of present illness negative except for sinus allergies, hearing problems  Past Medical History:  Diagnosis Date  . Asthma   . Atopic rhinitis   . Chronic sinusitis   . Depression   . Hemorrhoids   . Hernia   . Hyperlipidemia   . OSA (obstructive sleep apnea)     Past Surgical History:  Procedure Laterality Date  . APPENDECTOMY    . FOOT SURGERY     Left  . HERNIA REPAIR    . INGUINAL HERNIA  REPAIR  04/22/2011   Right  . NASAL SINUS SURGERY  12/09/10  . THYROGLOSSAL DUCT CYST  1993    Social History Jeffrey Wu.  reports that he quit smoking about 9 years ago. His smoking use included Cigarettes. He has a 50.00 pack-year smoking history. He has never used smokeless tobacco. He reports that he drinks alcohol. He reports that he does not use drugs.  family history includes Cancer in his father; Heart disease in his mother; Stroke in his mother.  Allergies  Allergen Reactions  . Aspirin Shortness Of Breath  . Ibuprofen Shortness Of Breath    Nausea and vomiting also       PHYSICAL EXAMINATION: Vital signs: BP 116/60   Pulse 72   Ht 5' 11.25" (1.81 m)   Wt 196 lb (88.9 kg)   BMI 27.14 kg/m   Constitutional: generally well-appearing, no acute distress Psychiatric: alert and oriented x3, cooperative Eyes: extraocular movements intact, anicteric, conjunctiva pink Mouth: oral pharynx moist, no lesions Neck: supple no lymphadenopathy Cardiovascular: heart regular rate and rhythm, no murmur Lungs: clear to auscultation bilaterally Abdomen: soft, nontender, nondistended, no obvious ascites, no peritoneal signs, normal bowel sounds, no organomegaly Rectal:Deferred until colonoscopy Extremities: no clubbing cyanosis or lower extremity edema bilaterally Skin: no lesions on visible extremities Neuro: No focal deficits. Cranial nerves intact .  ASSESSMENT:  #1. Chronic intermittent dysphagia for liquids as described. Most consistent with oropharyngeal dysphagia. Rule out intrinsic esophageal  process #2. Question GERD #3. Colon cancer screening. Baseline risk. Due for follow-up screening #4. History of hemorrhoidal banding  PLAN:  #1. Reflux precautions #2. Continue PPI as previously prescribed for upper GI mucosal protection on chronic aspirin therapy #3. Schedule upper endoscopy to evaluate swallowing complaints and assess for objective evidence of GERD.The nature  of the procedure, as well as the risks, benefits, and alternatives were carefully and thoroughly reviewed with the patient. Ample time for discussion and questions allowed. The patient understood, was satisfied, and agreed to proceed. #4. Screening colonoscopy.The nature of the procedure, as well as the risks, benefits, and alternatives were carefully and thoroughly reviewed with the patient. Ample time for discussion and questions allowed. The patient understood, was satisfied, and agreed to proceed. #5. If upper endoscopy unrevealing proceed with modified barium swallow with speech pathology to rule out oral pharyngeal dysphagia that would require swallowing counseling by the therapist. Discussed  A copy of this consultation note has been sent to Dr. Renold Genta

## 2016-08-16 NOTE — Patient Instructions (Signed)

## 2016-08-17 ENCOUNTER — Other Ambulatory Visit: Payer: Medicare Other | Admitting: Internal Medicine

## 2016-08-17 DIAGNOSIS — R7302 Impaired glucose tolerance (oral): Secondary | ICD-10-CM

## 2016-08-17 DIAGNOSIS — E785 Hyperlipidemia, unspecified: Secondary | ICD-10-CM

## 2016-08-18 LAB — LIPID PANEL
CHOLESTEROL: 229 mg/dL — AB (ref <200)
HDL: 103 mg/dL (ref >40)
LDL Cholesterol: 115 mg/dL — ABNORMAL HIGH (ref <100)
Total CHOL/HDL Ratio: 2.2 Ratio (ref <5.0)
Triglycerides: 57 mg/dL (ref <150)
VLDL: 11 mg/dL (ref <30)

## 2016-08-18 LAB — HEPATIC FUNCTION PANEL
ALBUMIN: 3.8 g/dL (ref 3.6–5.1)
ALT: 21 U/L (ref 9–46)
AST: 20 U/L (ref 10–35)
Alkaline Phosphatase: 77 U/L (ref 40–115)
BILIRUBIN DIRECT: 0.1 mg/dL (ref <=0.2)
BILIRUBIN INDIRECT: 0.4 mg/dL (ref 0.2–1.2)
TOTAL PROTEIN: 6.2 g/dL (ref 6.1–8.1)
Total Bilirubin: 0.5 mg/dL (ref 0.2–1.2)

## 2016-08-18 LAB — HEMOGLOBIN A1C
HEMOGLOBIN A1C: 5.9 % — AB (ref <5.7)
MEAN PLASMA GLUCOSE: 123 mg/dL

## 2016-08-18 LAB — MICROALBUMIN / CREATININE URINE RATIO
Creatinine, Urine: 190 mg/dL (ref 20–370)
MICROALB UR: 1 mg/dL
Microalb Creat Ratio: 5 mcg/mg creat (ref <30)

## 2016-08-19 ENCOUNTER — Encounter: Payer: Self-pay | Admitting: Internal Medicine

## 2016-08-19 ENCOUNTER — Ambulatory Visit (INDEPENDENT_AMBULATORY_CARE_PROVIDER_SITE_OTHER): Payer: Medicare Other | Admitting: Internal Medicine

## 2016-08-19 VITALS — BP 100/58 | HR 66 | Temp 97.7°F | Wt 194.0 lb

## 2016-08-19 DIAGNOSIS — E785 Hyperlipidemia, unspecified: Secondary | ICD-10-CM

## 2016-08-19 DIAGNOSIS — F339 Major depressive disorder, recurrent, unspecified: Secondary | ICD-10-CM | POA: Diagnosis not present

## 2016-08-19 DIAGNOSIS — R7302 Impaired glucose tolerance (oral): Secondary | ICD-10-CM | POA: Diagnosis not present

## 2016-08-19 DIAGNOSIS — R55 Syncope and collapse: Secondary | ICD-10-CM | POA: Diagnosis not present

## 2016-08-19 NOTE — Progress Notes (Signed)
   Subjective:    Patient ID: Jeffrey Wu., male    DOB: November 22, 1945, 71 y.o.   MRN: [id number]  HPI   71  year old  Male here for six-month recheck on hyperlipidemia and impaired glucose tolerance.  He has a history of depression and recently was started back on Paxil 5 mg daily but he's had an episode of syncope since restarting Paxil.  His total cholesterol is 229 with an LDL cholesterol of 1:15. Hemoglobin A1c is 5.9%.  He has chronic allergic rhinitis and is on prednisone chronically 5 mg daily.  He is on Crestor 10 mg daily.    Review of Systems says he is less cranky on Paxil. He has taken it for many years. We stopped it recently because of syncope and unfortunately syncope has recurred when we restarted it     Objective:   Physical Exam His blood pressure on arrival was 100/58. He does drop up to 10 mmHg with position change from lying to standing. Chest clear. Cardiac exam regular rate and rhythm. No JVD. No carotid bruits. No lower extremity edema. Depression has improved considerably.       Assessment & Plan:  Orthostatic hypotension-likely related to Paxil and likely causing syncope  History of depression  Hyperlipidemia-despite Crestor 10 mg daily is still increased. Work on diet and exercise.  Impaired glucose tolerance-stable  Plan: He is to continue Crestor and work on diet and exercise. Discontinue Paxil and try low-dose Viibryd. Samples provided. Follow-up in a couple of weeks with regard orthostasis and depression.

## 2016-08-22 NOTE — Patient Instructions (Addendum)
Change Paxil to Viibryd follow-up in 2-3 weeks. Samples provided. Work on diet and exercise. Continue same dose of Crestor. Follow-up is August 30.

## 2016-09-02 ENCOUNTER — Ambulatory Visit (INDEPENDENT_AMBULATORY_CARE_PROVIDER_SITE_OTHER): Payer: Medicare Other | Admitting: Internal Medicine

## 2016-09-02 ENCOUNTER — Encounter: Payer: Self-pay | Admitting: Internal Medicine

## 2016-09-02 VITALS — BP 104/60 | HR 60 | Temp 97.4°F | Wt 194.0 lb

## 2016-09-02 DIAGNOSIS — I951 Orthostatic hypotension: Secondary | ICD-10-CM

## 2016-09-02 MED ORDER — BUPROPION HCL ER (XL) 150 MG PO TB24: 150.0000 mg | ORAL_TABLET | Freq: Every day | ORAL | 0 refills | Status: DC

## 2016-09-02 NOTE — Progress Notes (Signed)
   Subjective:    Patient ID: Jeffrey Wu., male    DOB: 1945-03-16, 72 y.o.   MRN: [id number]  HPI   71 year old Male for follow up of orthostasis and depression.His mood is stable. He's had a couple of episodes of feeling dizzy when he stands. Blood pressure readings reveal a 6 mm drop on standing.  He is also on Jalyn containing tamsulosin which can cause orthostasis.  At last visit, we tried him on Vibryyd. He was only taking half of 10 mg tablet. He didn't tolerate Paxil 5 mg daily either.        Review of Systems see above     Objective:   Physical Exam  Spent 15 minutes speaking with him about these issues. Doesn't want to stop tamsulosin because of urine stream issues from BPH. However he feels he needs some mood stabilizer.      Assessment & Plan:  Orthostatic hypotension  Depression  Plan: Try Wellbutrin XL 150 mg daily #30 with no refill and follow-up in 4 weeks

## 2016-09-08 ENCOUNTER — Emergency Department (HOSPITAL_COMMUNITY): Payer: Medicare Other

## 2016-09-08 ENCOUNTER — Emergency Department (HOSPITAL_COMMUNITY)
Admission: EM | Admit: 2016-09-08 | Discharge: 2016-09-08 | Disposition: A | Discharge status: Home / Self Care | Payer: Medicare Other | Attending: Emergency Medicine | Admitting: Emergency Medicine

## 2016-09-08 DIAGNOSIS — J45909 Unspecified asthma, uncomplicated: Secondary | ICD-10-CM | POA: Diagnosis not present

## 2016-09-08 DIAGNOSIS — Z87891 Personal history of nicotine dependence: Secondary | ICD-10-CM | POA: Insufficient documentation

## 2016-09-08 DIAGNOSIS — Z79899 Other long term (current) drug therapy: Secondary | ICD-10-CM | POA: Diagnosis not present

## 2016-09-08 DIAGNOSIS — K5900 Constipation, unspecified: Secondary | ICD-10-CM

## 2016-09-08 DIAGNOSIS — R1031 Right lower quadrant pain: Secondary | ICD-10-CM | POA: Diagnosis present

## 2016-09-08 LAB — CBC
HEMATOCRIT: 42.9 % (ref 39.0–52.0)
Hemoglobin: 14 g/dL (ref 13.0–17.0)
MCH: 31.7 pg (ref 26.0–34.0)
MCHC: 32.6 g/dL (ref 30.0–36.0)
MCV: 97.3 fL (ref 78.0–100.0)
Platelets: 177 10*3/uL (ref 150–400)
RBC: 4.41 MIL/uL (ref 4.22–5.81)
RDW: 13.4 % (ref 11.5–15.5)
WBC: 6.7 10*3/uL (ref 4.0–10.5)

## 2016-09-08 LAB — LIPASE, BLOOD: Lipase: 33 U/L (ref 11–51)

## 2016-09-08 LAB — COMPREHENSIVE METABOLIC PANEL
ALT: 21 U/L (ref 17–63)
AST: 22 U/L (ref 15–41)
Albumin: 3.4 g/dL — ABNORMAL LOW (ref 3.5–5.0)
Alkaline Phosphatase: 63 U/L (ref 38–126)
Anion gap: 9 (ref 5–15)
BILIRUBIN TOTAL: 0.5 mg/dL (ref 0.3–1.2)
BUN: 17 mg/dL (ref 6–20)
CHLORIDE: 104 mmol/L (ref 101–111)
CO2: 25 mmol/L (ref 22–32)
Calcium: 9.1 mg/dL (ref 8.9–10.3)
Creatinine, Ser: 1.09 mg/dL (ref 0.61–1.24)
GFR calc Af Amer: >60 mL/min (ref >60)
GFR calc non Af Amer: >60 mL/min (ref >60)
GLUCOSE: 102 mg/dL — AB (ref 65–99)
POTASSIUM: 3.8 mmol/L (ref 3.5–5.1)
Sodium: 138 mmol/L (ref 135–145)
Total Protein: 6 g/dL — ABNORMAL LOW (ref 6.5–8.1)

## 2016-09-08 LAB — URINALYSIS, ROUTINE W REFLEX MICROSCOPIC
BILIRUBIN URINE: NEGATIVE
GLUCOSE, UA: NEGATIVE mg/dL
Hgb urine dipstick: NEGATIVE
Ketones, ur: NEGATIVE mg/dL
Leukocytes, UA: NEGATIVE
Nitrite: NEGATIVE
Protein, ur: NEGATIVE mg/dL
Specific Gravity, Urine: 1.017 (ref 1.005–1.030)
pH: 5 (ref 5.0–8.0)

## 2016-09-08 MED ORDER — SODIUM CHLORIDE 0.9 % IV BOLUS (SEPSIS)
500.0000 mL | Freq: Once | INTRAVENOUS | Status: AC
  Administered 2016-09-08: 500 mL via INTRAVENOUS

## 2016-09-08 MED ORDER — IOPAMIDOL (ISOVUE-300) INJECTION 61%
INTRAVENOUS | Status: AC
  Administered 2016-09-08: 100 mL
  Filled 2016-09-08: qty 100

## 2016-09-08 MED ORDER — POLYETHYLENE GLYCOL 3350 17 G PO PACK: 17.0000 g | PACK | Freq: Every day | ORAL | 0 refills | Status: DC

## 2016-09-08 NOTE — Discharge Instructions (Signed)
Please read attached information regarding your condition. Take MiraLAX as needed for constipation. Follow-up using information listed below for further evaluation. Return to ED for severe abdominal pain, increased vomiting, blood in stool, fever.

## 2016-09-08 NOTE — ED Triage Notes (Signed)
Pt reports severe RLQ pain yesterday and today into his groin, hx of hernia. Denies n/v/d.

## 2016-09-08 NOTE — ED Provider Notes (Signed)
  Physical Exam  BP 129/75   Pulse (!) 55   Temp 98.9 F (37.2 C) (Oral)   Resp 16   SpO2 94%   Physical Exam  Constitutional: He appears well-developed and well-nourished. No distress.  Patient appears comfortable and nontoxic.  HENT:  Head: Normocephalic and atraumatic.  Eyes: Conjunctivae and EOM are normal. No scleral icterus.  Neck: Normal range of motion.  Pulmonary/Chest: Effort normal. No respiratory distress.  Neurological: He is alert.  Skin: No rash noted. He is not diaphoretic.  Psychiatric: He has a normal mood and affect.  Nursing note and vitals reviewed.   ED Course  Procedures  MDM Care signed out from previous provider Will Dansie, PA-C. Briefly, patient presents for right lower quadrant and right groin pain for the past month acutely worsened since yesterday. He does have a history of right inguinal hernia repair and appendectomy several years ago. He is afebrile and nontoxic appearing. Physical exam is unremarkable. Lab work unremarkable. We'll obtain CT of the abdomen and pelvis and dispo accordingly.  1645: CT showed evidence of constipation without bowel obstruction or other acute intra-abdominal or pelvic process. We will give MiraLAX to help with constipation and advised patient to follow-up with Dr. Ninfa Linden for further evaluation.     Delia Heady, PA-C 09/08/16 1649    Margette Fast, MD 09/09/16 1122

## 2016-09-08 NOTE — ED Provider Notes (Signed)
[facility] DEPT Provider Note   CSN: [id number] Arrival date & time: 09/08/16  1035     History   Chief Complaint Chief Complaint  Patient presents with  . Abdominal Pain  . Groin Pain    HPI Jeffrey Soth. is a 71 y.o. male.  Jeffrey Wu. Is a 71 y.o. Male who presents to the emergency department complaining of right lower quadrant right groin pain ongoing for the past month that is acutely worsened since yesterday. Patient reports yesterday he had extreme right groin pain that has now subsided. He reports only mild pain with leaning forward. He reports a history of a right inguinal hernia repair several years ago by surgeon Dr. Ninfa Linden. He also has had a previous appendectomy. He denies any nausea or vomiting. Last bowel movement was this morning and was normal. No treatments taken prior to arrival today. He denies any pain to his penis or testicles. He denies fevers, coughing, vomiting, diarrhea, rashes, penile pain, testicular pain, urinary symptoms, hematuria, difficulty urinating, chest pain or shortness of breath.   The history is provided by the patient and medical records. No language interpreter was used.  Abdominal Pain   Pertinent negatives include fever, diarrhea, nausea, vomiting, dysuria, frequency, hematuria and headaches.  Groin Pain  Associated symptoms include abdominal pain. Pertinent negatives include no chest pain, no headaches and no shortness of breath.    Past Medical History:  Diagnosis Date  . Asthma   . Atopic rhinitis   . Chronic sinusitis   . Depression   . Hemorrhoids   . Hernia   . Hyperlipidemia   . OSA (obstructive sleep apnea)     Patient Active Problem List   Diagnosis Date Noted  . Senile purpura ([place]) 02/19/2016  . Impaired glucose tolerance 06/14/2014  . Unspecified hereditary and idiopathic peripheral neuropathy 06/03/2013  . Hyperlipidemia 09/27/2010  . OSA (obstructive sleep apnea) 09/27/2010  . Allergic rhinitis  09/27/2010  . BPH (benign prostatic hyperplasia) 09/27/2010    Past Surgical History:  Procedure Laterality Date  . APPENDECTOMY    . FOOT SURGERY     Left  . HERNIA REPAIR    . INGUINAL HERNIA REPAIR  04/22/2011   Right  . NASAL SINUS SURGERY  12/09/10  . THYROGLOSSAL DUCT CYST  1993       Home Medications    Prior to Admission medications   Medication Sig Start Date End Date Taking? Authorizing Provider  aspirin 325 MG tablet Take 325 mg by mouth 2 (two) times daily.     [provider]  buPROPion (WELLBUTRIN XL) 150 MG 24 hr tablet Take 1 tablet (150 mg total) by mouth daily. 09/02/16   Elby Showers, MD  Dutasteride-Tamsulosin HCl (JALYN) 0.5-0.4 MG CAPS Take 1 tablet by mouth daily. 02/19/16   Elby Showers, MD  fexofenadine (ALLEGRA) 180 MG tablet Take 180 mg by mouth.    [provider]  hydrocortisone (ANUSOL-HC) 25 MG suppository PLACE 1 SUPPOSITORY (25 MG TOTAL) RECTALLY 2 (TWO) TIMES DAILY. Patient not taking: Reported on 08/19/2016 07/08/15   Elby Showers, MD  mometasone-formoterol [facility]) 100-5 MCG/ACT AERO Inhale 2 puffs in the morning, and 2 puffs in the evening.Inhale 2 puffs in the morning, and 2 puffs in the evening. 02/19/16   Elby Showers, MD  montelukast (SINGULAIR) 10 MG tablet Take 1 tablet (10 mg total) by mouth every evening. 02/19/16   Elby Showers, MD  omeprazole (PRILOSEC) 40 MG  capsule Take 1 capsule (40 mg total) by mouth daily. 02/19/16   Elby Showers, MD  prednisoLONE acetate (PRED FORTE) 1 % ophthalmic suspension APPLY 3 DROPS TO EACH SIDE OF NOSE IN HAND HANGING POSITION TWICE A DAY. (OK FOR NASAL USE) 06/03/15   [provider]  predniSONE (DELTASONE) 5 MG tablet Take 5 mg by mouth daily with breakfast. Pt takes  10mg  some days    [provider]  rosuvastatin (CRESTOR) 10 MG tablet Take 1 tablet (10 mg total) by mouth daily. 02/19/16   Elby Showers, MD    Family History Family History  Problem Relation  Age of Onset  . Cancer Father        leukemia  . Heart disease Mother   . Stroke Mother     Social History Social History  Substance Use Topics  . Smoking status: Former Smoker    Packs/day: 1.00    Years: 50.00    Types: Cigarettes    Quit date: 01/05/2007  . Smokeless tobacco: Never Used  . Alcohol use Yes     Allergies   Aspirin and Ibuprofen   Review of Systems Review of Systems  Constitutional: Negative for chills and fever.  HENT: Negative for congestion and sore throat.   Eyes: Negative for visual disturbance.  Respiratory: Negative for cough, shortness of breath and wheezing.   Cardiovascular: Negative for chest pain and palpitations.  Gastrointestinal: Positive for abdominal pain. Negative for diarrhea, nausea and vomiting.  Genitourinary: Negative for decreased urine volume, difficulty urinating, dysuria, frequency, hematuria, penile pain, penile swelling, scrotal swelling, testicular pain and urgency.  Musculoskeletal: Negative for back pain and neck pain.  Skin: Negative for rash.  Neurological: Negative for headaches.     Physical Exam Updated Vital Signs BP 129/75   Pulse (!) 55   Temp 98.9 F (37.2 C) (Oral)   Resp 16   SpO2 94%   Physical Exam  Constitutional: He appears well-developed and well-nourished. No distress.  Nontoxic appearing.  HENT:  Head: Normocephalic and atraumatic.  Mouth/Throat: Oropharynx is clear and moist.  Eyes: Pupils are equal, round, and reactive to light. Conjunctivae are normal. Right eye exhibits no discharge. Left eye exhibits no discharge.  Neck: Neck supple.  Cardiovascular: Normal rate, regular rhythm, normal heart sounds and intact distal pulses.  Exam reveals no gallop and no friction rub.   No murmur heard. Pulmonary/Chest: Effort normal and breath sounds normal. No respiratory distress. He has no wheezes. He has no rales.  Abdominal: Soft. Bowel sounds are normal. He exhibits no distension and no mass. There  is tenderness. There is no rebound and no guarding. No hernia.  Abdomen is soft. Bowel sounds are present. Patient has mild right inguinal tenderness palpation. No palpated hernias.  Genitourinary: Penis normal. No penile tenderness.  Genitourinary Comments: No penile or testicular TTP. No scrotal hernias.   Musculoskeletal: He exhibits no edema.  Lymphadenopathy:    He has no cervical adenopathy.  Neurological: He is alert. Coordination normal.  Skin: Skin is warm and dry. No rash noted. He is not diaphoretic. No erythema. No pallor.  Psychiatric: He has a normal mood and affect. His behavior is normal.  Nursing note and vitals reviewed.    ED Treatments / Results  Labs (all labs ordered are listed, but only abnormal results are displayed) Labs Reviewed  COMPREHENSIVE METABOLIC PANEL - Abnormal; Notable for the following:       Result Value   Glucose, Bld 102 (*)  Total Protein 6.0 (*)    Albumin 3.4 (*)    All other components within normal limits  LIPASE, BLOOD  CBC  URINALYSIS, ROUTINE W REFLEX MICROSCOPIC    EKG  EKG Interpretation None       Radiology No results found.  Procedures Procedures (including critical care time)  Medications Ordered in ED Medications  iopamidol (ISOVUE-300) 61 % injection (not administered)  sodium chloride 0.9 % bolus 500 mL (0 mLs Intravenous Stopped 09/08/16 1506)     Initial Impression / Assessment and Plan / ED Course  I have reviewed the triage vital signs and the nursing notes.  Pertinent labs & imaging results that were available during my care of the patient were reviewed by me and considered in my medical decision making (see chart for details).    This is a 71 y.o. Male who presents to the emergency department complaining of right lower quadrant right groin pain ongoing for the past month that is acutely worsened since yesterday. Patient reports yesterday he had extreme right groin pain that has now subsided. He  reports only mild pain with leaning forward. He reports a history of a right inguinal hernia repair several years ago by surgeon Dr. Ninfa Linden. He also has had a previous appendectomy. He denies any nausea or vomiting. Last bowel movement was this morning and was normal. No treatments taken prior to arrival today. He denies any pain to his penis or testicles.  On exam patient is afebrile nontoxic appearing. His abdomen is soft and bowel sounds are present. His mild right lower groin tenderness to palpation with deep palpation. No hernias palpated. GU exam is unremarkable.  Urinalysis is without sign of infection. Lipase is within normal limits. CMP is unremarkable. CBC is within normal limits. Will obtain a CT abdomen and pelvis with contrast.  At shift change patient is awaiting CT scan. Patient care signed out to [facility], PA-C at shift change who will disposition the patient following imaging. Plan is if CT scan is unremarkable he can be discharged with follow up by his general surgeon Dr. Ninfa Linden.    Final Clinical Impressions(s) / ED Diagnoses   Final diagnoses:  Right groin pain  Right lower quadrant abdominal pain    New Prescriptions New Prescriptions   No medications on file     Sharmaine Base 09/08/16 1516    Tanna Furry, MD 09/19/16 [phone]

## 2016-09-27 ENCOUNTER — Ambulatory Visit (INDEPENDENT_AMBULATORY_CARE_PROVIDER_SITE_OTHER): Payer: Medicare Other | Admitting: Internal Medicine

## 2016-09-27 ENCOUNTER — Encounter: Payer: Self-pay | Admitting: Internal Medicine

## 2016-09-27 VITALS — BP 110/70 | HR 68 | Temp 97.1°F | Wt 197.0 lb

## 2016-09-27 DIAGNOSIS — F39 Unspecified mood [affective] disorder: Secondary | ICD-10-CM

## 2016-09-27 DIAGNOSIS — I951 Orthostatic hypotension: Secondary | ICD-10-CM | POA: Diagnosis not present

## 2016-09-27 DIAGNOSIS — Z23 Encounter for immunization: Secondary | ICD-10-CM

## 2016-09-28 ENCOUNTER — Telehealth: Payer: Self-pay

## 2016-09-28 NOTE — Telephone Encounter (Signed)
-----   Message from Irene Shipper, MD sent at 09/28/2016 10:21 AM EDT ----- Yes, he needs to be a double. I'm ok to add an EGD to his procedure that day based on my schedule, which I believe can accommodate that addition. However, this needs to be approved by Lanelle Bal. Thanks jp  ----- Message ----- From: Audrea Muscat, CMA Sent: 09/28/2016  10:06 AM To: Irene Shipper, MD  Unfortunately I have a similar situation to the pt I asked about double booking the other day.  The previous one I did not schedule however this one I did.  The sheet I write on says colon for screening and just a colon was scheduled.  Your office note, however, does mention a double, I probably didn't hear your say endo as well.  Would you please go behind me and make sure I'm correct that patient needs a double.  He is scheduled for 10/9 at 2:00pm.  You have 2 singles and 2 doubles that afternoon.  If you prefer not to squeeze him in I will reschedule him somewhere else.  So so sorry for the inconvenience.  :(

## 2016-09-28 NOTE — Telephone Encounter (Signed)
-----   Message from Bea Laura, RN sent at 09/28/2016 11:35 AM EDT ----- Dennison Nancy that works.  Thanks again for checking with me.  Lanelle Bal  ----- Message ----- From: Audrea Muscat, CMA Sent: 09/28/2016  10:41 AM To: Bea Laura, RN  Hey - I'm so sorry I have this situation again! (in my defense I didn't mess up the other one!).  Is this ok?  Thanks!! ----- Message ----- From: Irene Shipper, MD Sent: 09/28/2016  10:21 AM To: Audrea Muscat, CMA, Bea Laura, RN  Yes, he needs to be a double. I'm ok to add an EGD to his procedure that day based on my schedule, which I believe can accommodate that addition. However, this needs to be approved by Lanelle Bal. Thanks jp  ----- Message ----- From: Audrea Muscat, CMA Sent: 09/28/2016  10:06 AM To: Irene Shipper, MD  Unfortunately I have a similar situation to the pt I asked about double booking the other day.  The previous one I did not schedule however this one I did.  The sheet I write on says colon for screening and just a colon was scheduled.  Your office note, however, does mention a double, I probably didn't hear your say endo as well.  Would you please go behind me and make sure I'm correct that patient needs a double.  He is scheduled for 10/9 at 2:00pm.  You have 2 singles and 2 doubles that afternoon.  If you prefer not to squeeze him in I will reschedule him somewhere else.  So so sorry for the inconvenience.  :(

## 2016-09-29 DIAGNOSIS — H6990 Unspecified Eustachian tube disorder, unspecified ear: Secondary | ICD-10-CM | POA: Diagnosis not present

## 2016-10-02 NOTE — Patient Instructions (Signed)
Continue with Wellbutrin and watch for position change orthostasis symptoms. I feel you do need a mood stabilizer and we have tried several. SSRIs seem to cause more orthostasis and Wellbutrin so we will continue with Wellbutrin for the present time

## 2016-10-02 NOTE — Progress Notes (Signed)
   Subjective:    Patient ID: Jeffrey Wu., male    DOB: January 17, 1945, 71 y.o.   MRN: [id number]  HPI At last visit on August 30, he was in for evaluation of orthostatic hypotension and depression. We had tried him on Paxil which caused vasovagal syncope and near-syncope. We tried him on Paxil and he had similar symptoms with orthostasis. So at last visit on August 30 we tried him on Wellbutrin XL 150 mg daily. He still having some orthostatic symptoms but less so than with the SSRI medications. Wellbutrin seems to be controlling his mood fairly well.   Review of Systems see above     Objective:   Physical Exam  He has a 10 mm drop in blood pressure when standing      Assessment & Plan:  Mood disorder  Orthostatic hypotension  secondary to antidepressant medication  Plan: Continue with current medications and return in February 2019 for physical exam.  Upcoming colonoscopy in October.

## 2016-10-05 ENCOUNTER — Encounter: Payer: Self-pay | Admitting: Internal Medicine

## 2016-10-07 ENCOUNTER — Ambulatory Visit (INDEPENDENT_AMBULATORY_CARE_PROVIDER_SITE_OTHER): Payer: Medicare Other | Admitting: Physician Assistant

## 2016-10-07 ENCOUNTER — Encounter: Payer: Self-pay | Admitting: Physician Assistant

## 2016-10-07 VITALS — BP 122/68 | HR 75 | Temp 98.3°F | Resp 16 | Ht 71.0 in | Wt 193.0 lb

## 2016-10-07 DIAGNOSIS — S40811A Abrasion of right upper arm, initial encounter: Secondary | ICD-10-CM

## 2016-10-07 NOTE — Patient Instructions (Signed)
     IF you received an x-ray today, you will receive an invoice from [facility]. Please contact [facility] at [phone] with questions or concerns regarding your invoice.   IF you received labwork today, you will receive an invoice from [facility]. Please contact [facility] at [phone] with questions or concerns regarding your invoice.   Our billing staff will not be able to assist you with questions regarding bills from these companies.  You will be contacted with the lab results as soon as they are available. The fastest way to get your results is to activate your My Chart account. Instructions are located on the last page of this paperwork. If you have not heard from us regarding the results in 2 weeks, please contact this office.     

## 2016-10-07 NOTE — Progress Notes (Signed)
PRIMARY CARE AT [place], [place] [address] [phone]  Date:  10/07/2016   Name:  Jeffrey Wu.   DOB:  1945-12-28   MRN:  [id number]  PCP:  Elby Showers, MD    History of Present Illness:  Jeffrey Wu. is a 71 y.o. male patient who presents to PCP with  Chief Complaint  Patient presents with  . Laceration    arms/ pt fell about a wk ago.     Patient reports that he fell last week.  He is not sure what happened, but fell injuring his right arm.  No LOC.  He reports that he is prone to falls.  He does endorse that he was drinking etOH.    Patient Active Problem List   Diagnosis Date Noted  . Senile purpura ([place]) 02/19/2016  . Impaired glucose tolerance 06/14/2014  . Unspecified hereditary and idiopathic peripheral neuropathy 06/03/2013  . Hyperlipidemia 09/27/2010  . OSA (obstructive sleep apnea) 09/27/2010  . Allergic rhinitis 09/27/2010  . BPH (benign prostatic hyperplasia) 09/27/2010    Past Medical History:  Diagnosis Date  . Asthma   . Atopic rhinitis   . Chronic sinusitis   . Depression   . Hemorrhoids   . Hernia   . Hyperlipidemia   . OSA (obstructive sleep apnea)     Past Surgical History:  Procedure Laterality Date  . APPENDECTOMY    . FOOT SURGERY     Left  . HERNIA REPAIR    . INGUINAL HERNIA REPAIR  04/22/2011   Right  . NASAL SINUS SURGERY  12/09/10  . THYROGLOSSAL DUCT CYST  1993    Social History  Substance Use Topics  . Smoking status: Former Smoker    Packs/day: 1.00    Years: 50.00    Types: Cigarettes    Quit date: 01/05/2007  . Smokeless tobacco: Never Used  . Alcohol use Yes    Family History  Problem Relation Age of Onset  . Cancer Father        leukemia  . Heart disease Mother   . Stroke Mother     Allergies  Allergen Reactions  . Aspirin Shortness Of Breath  . Ibuprofen Shortness Of Breath    Nausea and vomiting also    Medication list has been reviewed and updated.  Current Outpatient  Prescriptions on File Prior to Visit  Medication Sig Dispense Refill  . aspirin 325 MG tablet Take 325 mg by mouth 2 (two) times daily.     Marland Kitchen buPROPion (WELLBUTRIN XL) 150 MG 24 hr tablet Take 1 tablet (150 mg total) by mouth daily. 30 tablet 0  . Dutasteride-Tamsulosin HCl (JALYN) 0.5-0.4 MG CAPS Take 1 tablet by mouth daily. 90 capsule 3  . fexofenadine (ALLEGRA) 180 MG tablet Take 180 mg by mouth.    . mometasone-formoterol (DULERA) 100-5 MCG/ACT AERO Inhale 2 puffs in the morning, and 2 puffs in the evening.Inhale 2 puffs in the morning, and 2 puffs in the evening. 13 g pr  . montelukast (SINGULAIR) 10 MG tablet Take 1 tablet (10 mg total) by mouth every evening. 90 tablet 3  . omeprazole (PRILOSEC) 40 MG capsule Take 1 capsule (40 mg total) by mouth daily. 90 capsule 3  . prednisoLONE acetate (PRED FORTE) 1 % ophthalmic suspension APPLY 3 DROPS TO EACH SIDE OF NOSE IN HAND HANGING POSITION TWICE A DAY. (OK FOR NASAL USE)  6  . predniSONE (DELTASONE) 5 MG tablet Take 5  mg by mouth daily with breakfast. Pt takes  10mg  some days    . rosuvastatin (CRESTOR) 10 MG tablet Take 1 tablet (10 mg total) by mouth daily. 90 tablet 3  . hydrocortisone (ANUSOL-HC) 25 MG suppository PLACE 1 SUPPOSITORY (25 MG TOTAL) RECTALLY 2 (TWO) TIMES DAILY. (Patient not taking: Reported on 08/19/2016) 12 suppository prn  . polyethylene glycol (MIRALAX / GLYCOLAX) packet Take 17 g by mouth daily. (Patient not taking: Reported on 09/27/2016) 14 each 0   No current facility-administered medications on file prior to visit.     ROS ROS otherwise unremarkable unless listed above.  Physical Examination: BP 122/68   Pulse 75   Temp 98.3 F (36.8 C) (Oral)   Resp 16   Ht 5\' 11"  (1.803 m)   Wt 193 lb (87.5 kg)   SpO2 94%   BMI 26.92 kg/m  Ideal Body Weight: Weight in (lb) to have BMI = 25: 178.9  Physical Exam  Constitutional: He is oriented to person, place, and time. He appears well-developed and well-nourished.  No distress.  HENT:  Head: Normocephalic and atraumatic.  Eyes: Pupils are equal, round, and reactive to light. Conjunctivae and EOM are normal.  Cardiovascular: Normal rate.   Pulmonary/Chest: Effort normal. No respiratory distress.  Neurological: He is alert and oriented to person, place, and time.  Skin: Skin is warm and dry. He is not diaphoretic.  Right arm abrasions along the posterior.  No drainage, or foreign body.    Psychiatric: He has a normal mood and affect. His behavior is normal.     Assessment and Plan: Jeffrey Wu. is a 71 y.o. male who is here today for cc of  Chief Complaint  Patient presents with  . Laceration    arms/ pt fell about a wk ago.   Steri strips placed. Bandages applied. Advised to decrease alcohol use.  He will follow up with pcp. Abrasion of right upper extremity, initial encounter  Ivar Drape, PA-C [facility] 10/9/20188:36 AM

## 2016-10-12 ENCOUNTER — Encounter: Payer: Self-pay | Admitting: Internal Medicine

## 2016-10-12 ENCOUNTER — Ambulatory Visit (AMBULATORY_SURGERY_CENTER): Payer: Medicare Other | Admitting: Internal Medicine

## 2016-10-12 VITALS — BP 128/66 | HR 51 | Temp 98.0°F | Resp 12 | Ht 71.25 in | Wt 196.0 lb

## 2016-10-12 DIAGNOSIS — R1310 Dysphagia, unspecified: Secondary | ICD-10-CM | POA: Diagnosis not present

## 2016-10-12 DIAGNOSIS — D123 Benign neoplasm of transverse colon: Secondary | ICD-10-CM | POA: Diagnosis not present

## 2016-10-12 DIAGNOSIS — D124 Benign neoplasm of descending colon: Secondary | ICD-10-CM | POA: Diagnosis not present

## 2016-10-12 DIAGNOSIS — K449 Diaphragmatic hernia without obstruction or gangrene: Secondary | ICD-10-CM | POA: Diagnosis not present

## 2016-10-12 DIAGNOSIS — R1319 Other dysphagia: Secondary | ICD-10-CM

## 2016-10-12 DIAGNOSIS — D120 Benign neoplasm of cecum: Secondary | ICD-10-CM

## 2016-10-12 DIAGNOSIS — Z1211 Encounter for screening for malignant neoplasm of colon: Secondary | ICD-10-CM

## 2016-10-12 MED ORDER — SODIUM CHLORIDE 0.9 % IV SOLN: 500.0000 mL | INTRAVENOUS | Status: DC

## 2016-10-12 MED ORDER — FLEET ENEMA 7-19 GM/118ML RE ENEM
1.0000 | ENEMA | Freq: Once | RECTAL | Status: AC
  Administered 2016-10-12: 1 via RECTAL

## 2016-10-12 NOTE — Progress Notes (Signed)
Spontaneous respirations throughout. VSS. Resting comfortably. To PACU on room air. Report to  RN. 

## 2016-10-12 NOTE — Progress Notes (Signed)
Pt says last bm before coming today was brown liquid. Fleets enema x1 given per order Dr [name] . Bm gold/tan liquid ,no formed stool noted .

## 2016-10-12 NOTE — Op Note (Signed)
[name] Procedure Date: 10/12/2016 3:51 PM MRN: [id number] Endoscopist: Docia Chuck. [name] , MD Age: 71 Referring MD:  Date of Birth: 05/21/1945 Gender: Male Account #: [id number] Procedure:                Upper GI endoscopy Indications:              Oropharyngeal phase dysphagia, Suspected esophageal                            reflux Medicines:                Monitored Anesthesia Care Procedure:                Pre-Anesthesia Assessment:                           - Prior to the procedure, a History and Physical                            was performed, and patient medications and                            allergies were reviewed. The patient's tolerance of                            previous anesthesia was also reviewed. The risks                            and benefits of the procedure and the sedation                            options and risks were discussed with the patient.                            All questions were answered, and informed consent                            was obtained. Prior Anticoagulants: The patient has                            taken no previous anticoagulant or antiplatelet                            agents. ASA Grade Assessment: II - A patient with                            mild systemic disease. After reviewing the risks                            and benefits, the patient was deemed in                            satisfactory condition to undergo the procedure.  After obtaining informed consent, the endoscope was                            passed under direct vision. Throughout the                            procedure, the patient's blood pressure, pulse, and                            oxygen saturations were monitored continuously. The                            Endoscope was introduced through the mouth, and                            advanced to the second part of duodenum. The upper                             GI endoscopy was accomplished without difficulty.                            The patient tolerated the procedure well. Scope In: Scope Out: Findings:                 The esophagus was normal.                           There was a 5 cm sliding hiatal hernia.The stomach                            was otherwise normal.                           The examined duodenum was normal.                           The cardia and gastric fundus were normal on                            retroflexion. Complications:            No immediate complications. Estimated Blood Loss:     Estimated blood loss: none. Impression:               - Normal esophagus.                           - Normal stomach, except for 5 cm sliding hiatal                            hernia.                           - Normal examined duodenum.                           - No specimens collected. Recommendation:  1. Reflux precautions                           2. Continue medication for acid reflux                           3. My office will schedule modified barium swallow                            with speech pathology "choking spells, rule out                            oropharyngeal dysphagia". Docia Chuck. [name], MD 10/12/2016 4:43:32 PM This report has been signed electronically.

## 2016-10-12 NOTE — Patient Instructions (Signed)
** Handouts given on polyps, hemorrhoids, diverticulosis, and hiatal hernia. ** Dr. [name] office will call you to set up an Barium Swallow.   YOU HAD AN ENDOSCOPIC PROCEDURE TODAY AT [facility]:   Refer to the procedure report that was given to you for any specific questions about what was found during the examination.  If the procedure report does not answer your questions, please call your gastroenterologist to clarify.  If you requested that your care partner not be given the details of your procedure findings, then the procedure report has been included in a sealed envelope for you to review at your convenience later.  YOU SHOULD EXPECT: Some feelings of bloating in the abdomen. Passage of more gas than usual.  Walking can help get rid of the air that was put into your GI tract during the procedure and reduce the bloating. If you had a lower endoscopy (such as a colonoscopy or flexible sigmoidoscopy) you may notice spotting of blood in your stool or on the toilet paper. If you underwent a bowel prep for your procedure, you may not have a normal bowel movement for a few days.  Please Note:  You might notice some irritation and congestion in your nose or some drainage.  This is from the oxygen used during your procedure.  There is no need for concern and it should clear up in a day or so.  SYMPTOMS TO REPORT IMMEDIATELY:   Following lower endoscopy (colonoscopy or flexible sigmoidoscopy):  Excessive amounts of blood in the stool  Significant tenderness or worsening of abdominal pains  Swelling of the abdomen that is new, acute  Fever of 100F or higher   Following upper endoscopy (EGD)  Vomiting of blood or coffee ground material  New chest pain or pain under the shoulder blades  Painful or persistently difficult swallowing  New shortness of breath  Fever of 100F or higher  Black, tarry-looking stools  For urgent or emergent issues, a gastroenterologist can be reached  at any hour by calling [phone].   DIET:  We do recommend a small meal at first, but then you may proceed to your regular diet.  Drink plenty of fluids but you should avoid alcoholic beverages for 24 hours.  ACTIVITY:  You should plan to take it easy for the rest of today and you should NOT DRIVE or use heavy machinery until tomorrow (because of the sedation medicines used during the test).    FOLLOW UP: Our staff will call the number listed on your records the next business day following your procedure to check on you and address any questions or concerns that you may have regarding the information given to you following your procedure. If we do not reach you, we will leave a message.  However, if you are feeling well and you are not experiencing any problems, there is no need to return our call.  We will assume that you have returned to your regular daily activities without incident.  If any biopsies were taken you will be contacted by phone or by letter within the next 1-3 weeks.  Please call us at [phone] if you have not heard about the biopsies in 3 weeks.    SIGNATURES/CONFIDENTIALITY: You and/or your care partner have signed paperwork which will be entered into your electronic medical record.  These signatures attest to the fact that that the information above on your After Visit Summary has been reviewed and is understood.  Full responsibility  of the confidentiality of this discharge information lies with you and/or your care-partner.

## 2016-10-12 NOTE — Op Note (Signed)
[facility] Patient Name: Nashon Erbes Procedure Date: 10/12/2016 3:52 PM MRN: [id number] Endoscopist: Docia Chuck. [name] , MD Age: 71 Referring MD:  Date of Birth: 04-13-1945 Gender: Male Account #: [id number] Procedure:                Colonoscopy, with cold snare polypectomy x 6 Indications:              Screening for colorectal malignant neoplasm Medicines:                Monitored Anesthesia Care Procedure:                Pre-Anesthesia Assessment:                           - Prior to the procedure, a History and Physical                            was performed, and patient medications and                            allergies were reviewed. The patient's tolerance of                            previous anesthesia was also reviewed. The risks                            and benefits of the procedure and the sedation                            options and risks were discussed with the patient.                            All questions were answered, and informed consent                            was obtained. Prior Anticoagulants: The patient has                            taken no previous anticoagulant or antiplatelet                            agents. ASA Grade Assessment: II - A patient with                            mild systemic disease. After reviewing the risks                            and benefits, the patient was deemed in                            satisfactory condition to undergo the procedure.                           After obtaining informed consent, the colonoscope  was passed under direct vision. Throughout the                            procedure, the patient's blood pressure, pulse, and                            oxygen saturations were monitored continuously. The                            Colonoscope was introduced through the anus and                            advanced to the the cecum, identified by    appendiceal orifice and ileocecal valve. The                            ileocecal valve, appendiceal orifice, and rectum                            were photographed. The quality of the bowel                            preparation was initially fair but upgraded to                            adequate to identify polyps after vigorous                            irrigation, suctioning, and time. The colonoscopy                            was performed without difficulty. The patient                            tolerated the procedure well. The bowel preparation                            used was SUPREP As well as, enema given                            preprocedure secondary to prep quality concerns. Scope In: 4:01:00 PM Scope Out: 4:27:50 PM Scope Withdrawal Time: 0 hours 21 minutes 12 seconds  Total Procedure Duration: 0 hours 26 minutes 50 seconds  Findings:                 Six polyps were found in the descending colon,                            transverse colon and cecum. The polyps were 2 to 10                            mm in size. These polyps were removed with a cold  snare. Resection and retrieval were complete.                           Multiple small and large-mouthed diverticula were                            found in the sigmoid colon.                           Internal hemorrhoids were found during retroflexion.                           The exam was otherwise without abnormality on                            direct and retroflexion views. Complications:            No immediate complications. Estimated blood loss:                            None. Estimated Blood Loss:     Estimated blood loss: none. Impression:               - Six 2 to 10 mm polyps in the descending colon, in                            the transverse colon and in the cecum, removed with                            a cold snare. Resected and retrieved.                           -  Diverticulosis in the sigmoid colon.                           - Internal hemorrhoids.                           - The examination was otherwise normal on direct                            and retroflexion views. Recommendation:           - Repeat colonoscopy in 3 years for surveillance.                            More vigorous preparation required.                           - Patient has a contact number available for                            emergencies. The signs and symptoms of potential                            delayed complications were discussed with the  patient. Return to normal activities tomorrow.                            Written discharge instructions were provided to the                            patient.                           - Resume previous diet.                           - Continue present medications.                           - Await pathology results. Docia Chuck. [name], MD 10/12/2016 4:34:03 PM This report has been signed electronically.

## 2016-10-12 NOTE — Progress Notes (Signed)
Called to room to assist during endoscopic procedure.  Patient ID and intended procedure confirmed with present staff. Received instructions for my participation in the procedure from the performing physician.  

## 2016-10-13 ENCOUNTER — Telehealth: Payer: Self-pay

## 2016-10-13 ENCOUNTER — Other Ambulatory Visit: Payer: Self-pay

## 2016-10-13 DIAGNOSIS — R1312 Dysphagia, oropharyngeal phase: Secondary | ICD-10-CM

## 2016-10-13 NOTE — Telephone Encounter (Signed)
Called [phone] and left a messaged we tried to reach pt for a follow up call. maw

## 2016-10-13 NOTE — Telephone Encounter (Signed)
  Follow up Call-  Call Anetra Czerwinski number 10/12/2016  Post procedure Call Seher Schlagel phone  # [phone]  Permission to leave phone message Yes  Some recent data might be hidden     Patient questions:  Do you have a fever, pain , or abdominal swelling? No. Pain Score  0 *  Have you tolerated food without any problems? Yes.    Have you been able to return to your normal activities? Yes.    Do you have any questions about your discharge instructions: Diet   No. Medications  No. Follow up visit  No.  Do you have questions or concerns about your Care? No.  Actions: * If pain score is 4 or above: No action needed, pain <4.

## 2016-10-18 ENCOUNTER — Encounter: Payer: Self-pay | Admitting: Internal Medicine

## 2016-10-19 ENCOUNTER — Other Ambulatory Visit (HOSPITAL_COMMUNITY): Payer: Self-pay | Admitting: Internal Medicine

## 2016-10-19 ENCOUNTER — Encounter: Payer: Self-pay | Admitting: Internal Medicine

## 2016-10-19 ENCOUNTER — Ambulatory Visit (INDEPENDENT_AMBULATORY_CARE_PROVIDER_SITE_OTHER): Payer: Medicare Other | Admitting: Internal Medicine

## 2016-10-19 VITALS — BP 118/64 | HR 65 | Temp 97.4°F | Wt 193.0 lb

## 2016-10-19 DIAGNOSIS — R1310 Dysphagia, unspecified: Secondary | ICD-10-CM

## 2016-10-19 DIAGNOSIS — B029 Zoster without complications: Secondary | ICD-10-CM

## 2016-10-19 MED ORDER — VALACYCLOVIR HCL 1 G PO TABS: 1000.0000 mg | ORAL_TABLET | Freq: Three times a day (TID) | ORAL | 0 refills | Status: DC

## 2016-10-19 NOTE — Progress Notes (Signed)
   Subjective:    Patient ID: Jeffrey Wu., male    DOB: [birth year], 71 y.o.   MRN: [id number]  HPI He says he previously had Zostavax vaccine and that he had a previous episode of shingles in the left upper arm a few years ago.  Now has a small lesion left upper arm about the size of a quarter that has multiple small vesicles.  He is on chronic steroid therapy with prednisone for allergies and recurrent sinusitis.  Recently had an ER visit for abdominal pain that proved to be constipation.    Review of Systems see above     Objective:   Physical Exam  Quarter sized lesion upper inner left arm consistent with herpes zoster with multiple small vesicles on an erythematous base        Assessment & Plan:  Herpes zoster-recurrent left upper arm  Plan: Valtrex 1 g  orally 3 times a day for 7 days  Recommend Shingrix vaccine in about 6 months

## 2016-10-24 NOTE — Patient Instructions (Signed)
Valtrex 1 g 3 times a day for 7 days.  Have Shingrix vaccine in about 6 months

## 2016-11-01 ENCOUNTER — Ambulatory Visit (HOSPITAL_COMMUNITY)
Admission: RE | Admit: 2016-11-01 | Discharge: 2016-11-01 | Disposition: A | Discharge status: Home / Self Care | Payer: Medicare Other | Source: Ambulatory Visit | Attending: Internal Medicine | Admitting: Internal Medicine

## 2016-11-01 DIAGNOSIS — R1312 Dysphagia, oropharyngeal phase: Secondary | ICD-10-CM | POA: Insufficient documentation

## 2016-11-01 DIAGNOSIS — R1310 Dysphagia, unspecified: Secondary | ICD-10-CM | POA: Insufficient documentation

## 2016-11-01 NOTE — Progress Notes (Signed)
Objective Swallowing Evaluation: Type of Study: MBS-Modified Barium Swallow Study  Patient Details  Name: Jeffrey Wu. MRN: [id number] Date of Birth: June 15, 1945  Today's Date: 11/01/2016 Time: SLP Start Time (ACUTE ONLY): 1133-SLP Stop Time (ACUTE ONLY): 1148 SLP Time Calculation (min) (ACUTE ONLY): 15 min  Past Medical History:  Past Medical History:  Diagnosis Date  . Asthma   . Atopic rhinitis   . Chronic sinusitis   . Depression   . Hemorrhoids   . Hernia   . Hyperlipidemia   . OSA (obstructive sleep apnea)    Past Surgical History:  Past Surgical History:  Procedure Laterality Date  . APPENDECTOMY    . FOOT SURGERY     Left  . HERNIA REPAIR    . INGUINAL HERNIA REPAIR  04/22/2011   Right  . NASAL SINUS SURGERY  12/09/10  . THYROGLOSSAL DUCT CYST  1993   HPI: Jeffrey Wuis a 71 y.o.malepatient with medical history of obsrtuctive sleep apnea, hyperlipidemia, hernia, acid reflux and recurrent sinusitis. GI on 10/9 shows normal esophagus and stomach, except for 5cm sliding hiatal hernia. SLP MBS ordered to rule out oropharyngeal dysphagia; pt c/o sensation of substance lodged in his throat beginning approximately 3 months ago, throat clearing following meals and "choking spells" with all consistencies including his saliva.  No Data Recorded   Assessment / Plan / Recommendation  CHL IP CLINICAL IMPRESSIONS 11/01/2016  Clinical Impression Pt's swallow function within normal limits; mild coating of base of tongue noted. Suspect pt's globus sensation possibly secondary to symptoms of acid reflux or recurrent sinusitis. Bony protrusions on the cervical spine observed; did not affect swallow function. Discussed findings with pt and advised him follow-up with his gastroenterologist if he desires further testing/information.  SLP Visit Diagnosis Dysphagia, unspecified (R13.10)  Attention and concentration deficit following --  Frontal lobe and executive function  deficit following --  Impact on safety and function No limitations      No flowsheet data found.   Prognosis 11/01/2016  Prognosis for Safe Diet Advancement Good  Barriers to Reach Goals --  Barriers/Prognosis Comment --    CHL IP DIET RECOMMENDATION 11/01/2016  SLP Diet Recommendations Regular solids;Thin liquid  Liquid Administration via Cup;Straw  Medication Administration Whole meds with liquid  Compensations --  Postural Changes Remain semi-upright after after feeds/meals (Comment);Seated upright at 90 degrees      CHL IP OTHER RECOMMENDATIONS 11/01/2016  Recommended Consults --  Oral Care Recommendations Oral care BID  Other Recommendations --      No flowsheet data found.    No flowsheet data found.         CHL IP ORAL PHASE 11/01/2016  Oral Phase WFL  Oral - Pudding Teaspoon --  Oral - Pudding Cup --  Oral - Honey Teaspoon --  Oral - Honey Cup --  Oral - Nectar Teaspoon --  Oral - Nectar Cup --  Oral - Nectar Straw --  Oral - Thin Teaspoon --  Oral - Thin Cup --  Oral - Thin Straw --  Oral - Puree --  Oral - Mech Soft --  Oral - Regular --  Oral - Multi-Consistency --  Oral - Pill --  Oral Phase - Comment --    CHL IP PHARYNGEAL PHASE 11/01/2016  Pharyngeal Phase WFL  Pharyngeal- Pudding Teaspoon --  Pharyngeal --  Pharyngeal- Pudding Cup --  Pharyngeal --  Pharyngeal- Honey Teaspoon --  Pharyngeal --  Pharyngeal- Honey Cup --  Pharyngeal --  Pharyngeal- Nectar Teaspoon --  Pharyngeal --  Pharyngeal- Nectar Cup --  Pharyngeal --  Pharyngeal- Nectar Straw --  Pharyngeal --  Pharyngeal- Thin Teaspoon --  Pharyngeal --  Pharyngeal- Thin Cup --  Pharyngeal --  Pharyngeal- Thin Straw --  Pharyngeal --  Pharyngeal- Puree --  Pharyngeal --  Pharyngeal- Mechanical Soft --  Pharyngeal --  Pharyngeal- Regular --  Pharyngeal --  Pharyngeal- Multi-consistency --  Pharyngeal --  Pharyngeal- Pill --  Pharyngeal --  Pharyngeal Comment --      No flowsheet data found.  CHL IP GO 11/01/2016  Functional Assessment Tool Used clinical judgement  Functional Limitations Swallowing  Swallow Current Status [phone]) [place]  Swallow Goal Status (Z0092) [facility]  Swallow Discharge Status (Z3007) [facility]  Motor Speech Current Status [phone]) (None)  Motor Speech Goal Status [phone]) (None)  Motor Speech Goal Status (G2563) (None)  Spoken Language Comprehension Current Status [phone]) (None)  Spoken Language Comprehension Goal Status (S2876) (None)  Spoken Language Comprehension Discharge Status [phone]) (None)  Spoken Language Expression Current Status [phone]) (None)  Spoken Language Expression Goal Status [phone]) (None)  Spoken Language Expression Discharge Status [phone]) (None)  Attention Current Status (T3646) (None)  Attention Goal Status (O0321) (None)  Attention Discharge Status [phone]) (None)  Memory Current Status (N0037) (None)  Memory Goal Status (C4888) (None)  Memory Discharge Status (B1694) (None)  Voice Current Status (H0388) (None)  Voice Goal Status (E2800) (None)  Voice Discharge Status (L4917) (None)  Other Speech-Language Pathology Functional Limitation Current Status (H1505) (None)  Other Speech-Language Pathology Functional Limitation Goal Status (W9794) (None)  Other Speech-Language Pathology Functional Limitation Discharge Status [phone]) (None)    Jeffrey Wu, Jeffrey Wu 11/01/2016, 1:26 PM

## 2016-11-01 NOTE — Evaluation (Cosign Needed)
Objective Swallowing Evaluation: Type of Study: MBS-Modified Barium Swallow Study  Patient Details  Name: Jeffrey Wu. MRN: [id number] Date of Birth: 12-06-1945  Today's Date: 11/01/2016 Time: SLP Start Time (ACUTE ONLY): 1133-SLP Stop Time (ACUTE ONLY): 1148 SLP Time Calculation (min) (ACUTE ONLY): 15 min  Past Medical History:  Past Medical History:  Diagnosis Date  . Asthma   . Atopic rhinitis   . Chronic sinusitis   . Depression   . Hemorrhoids   . Hernia   . Hyperlipidemia   . OSA (obstructive sleep apnea)    Past Surgical History:  Past Surgical History:  Procedure Laterality Date  . APPENDECTOMY    . FOOT SURGERY     Left  . HERNIA REPAIR    . INGUINAL HERNIA REPAIR  04/22/2011   Right  . NASAL SINUS SURGERY  12/09/10  . THYROGLOSSAL DUCT CYST  1993   HPI: Jeffrey Wuis a 71 y.o.malepatient with medical history of obsrtuctive sleep apnea, hyperlipidemia, hernia, acid reflux and recurrent sinusitis. GI on 10/9 shows normal esophagus and stomach, except for 5cm sliding hiatal hernia. SLP MBS ordered to rule out oropharyngeal dysphagia; pt c/o sensation of substance lodged in his throat beginning approximately 3 months ago and "choking spells" with all consistencies including his saliva.  No Data Recorded   Assessment / Plan / Recommendation  CHL IP CLINICAL IMPRESSIONS 11/01/2016  Clinical Impression Pt's swallow function within normal limits; mild coating of base of tongue noted. Suspect pt's globus sensation possibly secondary to symptoms of acid reflux or recurrent sinusitis. Bony protrusions on the cervical spine observed; did not affect swallow function. Discussed results with pt and advised him to  follow-up with his gastroenterologist if he desires further testing/information.  SLP Visit Diagnosis Dysphagia, unspecified (R13.10)  Attention and concentration deficit following --  Frontal lobe and executive function deficit following --  Impact on  safety and function No limitations      No flowsheet data found.   Prognosis 11/01/2016  Prognosis for Safe Diet Advancement Good  Barriers to Reach Goals --  Barriers/Prognosis Comment --    CHL IP DIET RECOMMENDATION 11/01/2016  SLP Diet Recommendations Regular solids;Thin liquid  Liquid Administration via Cup;Straw  Medication Administration Whole meds with liquid  Compensations --  Postural Changes Remain semi-upright after after feeds/meals (Comment);Seated upright at 90 degrees      CHL IP OTHER RECOMMENDATIONS 11/01/2016  Recommended Consults --  Oral Care Recommendations Oral care BID  Other Recommendations --      No flowsheet data found.    No flowsheet data found.         CHL IP ORAL PHASE 11/01/2016  Oral Phase WFL  Oral - Pudding Teaspoon --  Oral - Pudding Cup --  Oral - Honey Teaspoon --  Oral - Honey Cup --  Oral - Nectar Teaspoon --  Oral - Nectar Cup --  Oral - Nectar Straw --  Oral - Thin Teaspoon --  Oral - Thin Cup --  Oral - Thin Straw --  Oral - Puree --  Oral - Mech Soft --  Oral - Regular --  Oral - Multi-Consistency --  Oral - Pill --  Oral Phase - Comment --    CHL IP PHARYNGEAL PHASE 11/01/2016  Pharyngeal Phase WFL  Pharyngeal- Pudding Teaspoon --  Pharyngeal --  Pharyngeal- Pudding Cup --  Pharyngeal --  Pharyngeal- Honey Teaspoon --  Pharyngeal --  Pharyngeal- Honey Cup --  Pharyngeal --  Pharyngeal- Nectar Teaspoon --  Pharyngeal --  Pharyngeal- Nectar Cup --  Pharyngeal --  Pharyngeal- Nectar Straw --  Pharyngeal --  Pharyngeal- Thin Teaspoon --  Pharyngeal --  Pharyngeal- Thin Cup --  Pharyngeal --  Pharyngeal- Thin Straw --  Pharyngeal --  Pharyngeal- Puree --  Pharyngeal --  Pharyngeal- Mechanical Soft --  Pharyngeal --  Pharyngeal- Regular --  Pharyngeal --  Pharyngeal- Multi-consistency --  Pharyngeal --  Pharyngeal- Pill --  Pharyngeal --  Pharyngeal Comment --     No flowsheet data found.  No  flowsheet data found.  Jeffrey Wu, Student SLP 11/01/2016, 12:18 PM

## 2016-11-15 ENCOUNTER — Telehealth: Payer: Self-pay

## 2016-11-15 MED ORDER — OMEPRAZOLE 40 MG PO CPDR: 40.0000 mg | DELAYED_RELEASE_CAPSULE | Freq: Every day | ORAL | 3 refills | Status: DC

## 2016-11-15 NOTE — Telephone Encounter (Signed)
Received fax from [facility] in regards to a refill on Prilosec 40 mg for patient. Medication was refilled per Dr. Verlene Mayer request. Sent 1 year

## 2016-11-29 ENCOUNTER — Other Ambulatory Visit: Payer: Self-pay

## 2016-11-29 MED ORDER — ROSUVASTATIN CALCIUM 10 MG PO TABS: 10.0000 mg | ORAL_TABLET | Freq: Every day | ORAL | 3 refills | Status: DC

## 2016-11-30 ENCOUNTER — Other Ambulatory Visit: Payer: Self-pay

## 2016-11-30 MED ORDER — ROSUVASTATIN CALCIUM 10 MG PO TABS: 10.0000 mg | ORAL_TABLET | Freq: Every day | ORAL | 3 refills | Status: DC

## 2016-12-06 ENCOUNTER — Other Ambulatory Visit: Payer: Self-pay

## 2016-12-06 MED ORDER — PAROXETINE HCL 10 MG PO TABS: 5.0000 mg | ORAL_TABLET | Freq: Every day | ORAL | 0 refills | Status: DC

## 2016-12-06 NOTE — Telephone Encounter (Signed)
Don't know why it won't let me know done this one out.  Maybe because it's a change in dose.  It wants you to put that change in??    Thanks so much.

## 2016-12-17 ENCOUNTER — Other Ambulatory Visit: Payer: Self-pay

## 2016-12-17 MED ORDER — DUTASTERIDE-TAMSULOSIN HCL 0.5-0.4 MG PO CAPS: 1.0000 | ORAL_CAPSULE | Freq: Every day | ORAL | 3 refills | Status: DC

## 2016-12-20 ENCOUNTER — Other Ambulatory Visit: Payer: Self-pay

## 2016-12-20 MED ORDER — BUPROPION HCL ER (XL) 150 MG PO TB24: 150.0000 mg | ORAL_TABLET | Freq: Every day | ORAL | 0 refills | Status: DC

## 2017-03-16 ENCOUNTER — Other Ambulatory Visit: Payer: Self-pay

## 2017-03-16 MED ORDER — BUPROPION HCL ER (XL) 150 MG PO TB24: 150.0000 mg | ORAL_TABLET | Freq: Every day | ORAL | 3 refills | Status: DC

## 2017-03-24 ENCOUNTER — Ambulatory Visit (INDEPENDENT_AMBULATORY_CARE_PROVIDER_SITE_OTHER): Payer: Medicare Other | Admitting: Internal Medicine

## 2017-03-24 VITALS — BP 110/70 | HR 76 | Temp 98.1°F | Ht 71.0 in | Wt 198.0 lb

## 2017-03-24 DIAGNOSIS — B349 Viral infection, unspecified: Secondary | ICD-10-CM

## 2017-03-24 DIAGNOSIS — R52 Pain, unspecified: Secondary | ICD-10-CM

## 2017-03-24 DIAGNOSIS — R6883 Chills (without fever): Secondary | ICD-10-CM | POA: Diagnosis not present

## 2017-03-24 LAB — CBC WITH DIFFERENTIAL/PLATELET
BASOS PCT: 0.4 %
Basophils Absolute: 53 cells/uL (ref 0–200)
Eosinophils Absolute: 226 cells/uL (ref 15–500)
Eosinophils Relative: 1.7 %
HEMATOCRIT: 42.4 % (ref 38.5–50.0)
HEMOGLOBIN: 14.7 g/dL (ref 13.2–17.1)
LYMPHS ABS: 1423 {cells}/uL (ref 850–3900)
MCH: 32.8 pg (ref 27.0–33.0)
MCHC: 34.7 g/dL (ref 32.0–36.0)
MCV: 94.6 fL (ref 80.0–100.0)
MPV: 11.3 fL (ref 7.5–12.5)
Monocytes Relative: 8.1 %
Neutro Abs: 10520 cells/uL — ABNORMAL HIGH (ref 1500–7800)
Neutrophils Relative %: 79.1 %
Platelets: 186 10*3/uL (ref 140–400)
RBC: 4.48 10*6/uL (ref 4.20–5.80)
RDW: 12.3 % (ref 11.0–15.0)
Total Lymphocyte: 10.7 %
WBC: 13.3 10*3/uL — ABNORMAL HIGH (ref 3.8–10.8)
WBCMIX: 1077 {cells}/uL — AB (ref 200–950)

## 2017-03-24 LAB — POCT URINALYSIS DIPSTICK
Appearance: NORMAL
Bilirubin, UA: NEGATIVE
Blood, UA: NEGATIVE
GLUCOSE UA: NEGATIVE
KETONES UA: NEGATIVE
Leukocytes, UA: NEGATIVE
NITRITE UA: NEGATIVE
Odor: NORMAL
PROTEIN UA: NEGATIVE
SPEC GRAV UA: 1.015 (ref 1.010–1.025)
Urobilinogen, UA: 0.2 E.U./dL
pH, UA: 6 (ref 5.0–8.0)

## 2017-03-24 LAB — POCT RAPID STREP A (OFFICE): Rapid Strep A Screen: NEGATIVE

## 2017-03-24 LAB — POCT INFLUENZA A/B
Influenza A, POC: NEGATIVE
Influenza B, POC: NEGATIVE

## 2017-03-24 MED ORDER — OSELTAMIVIR PHOSPHATE 75 MG PO CAPS: 75.0000 mg | ORAL_CAPSULE | Freq: Two times a day (BID) | ORAL | 0 refills | Status: DC

## 2017-03-25 ENCOUNTER — Telehealth: Payer: Self-pay | Admitting: Internal Medicine

## 2017-03-25 ENCOUNTER — Ambulatory Visit
Admission: RE | Admit: 2017-03-25 | Discharge: 2017-03-25 | Disposition: A | Discharge status: Home / Self Care | Payer: Medicare Other | Source: Ambulatory Visit | Attending: Internal Medicine | Admitting: Internal Medicine

## 2017-03-25 ENCOUNTER — Encounter: Payer: Self-pay | Admitting: Internal Medicine

## 2017-03-25 MED ORDER — DOXYCYCLINE HYCLATE 100 MG PO TABS: 100.0000 mg | ORAL_TABLET | Freq: Two times a day (BID) | ORAL | 0 refills | Status: DC

## 2017-03-25 NOTE — Patient Instructions (Signed)
Rapid flu test and rapid strep screens are negative.  Take Tamiflu 75 mg twice daily.  May take ibuprofen for myalgias.  Rest and drink plenty of fluids.  We will call you with CBC results and follow-up tomorrow.  Addendum: Friday, March 22-patient is to have chest x-ray today

## 2017-03-25 NOTE — Progress Notes (Signed)
   Subjective:    Patient ID: Jeffrey Wu., male    DOB: 1945/02/15, 72 y.o.   MRN: [id number]  HPI 72 year old White male who awakened this morning with chills and myalgias.  He did take flu vaccine.  No documented fever.  He took ibuprofen and feels a little better this afternoon.  No nausea or vomiting.  He has chronic allergy problems and is on low-dose prednisone per allergist at [facility].  No significant cough.  Has considerable malaise.  No urinary symptoms.  No diarrhea.    Review of Systems see above     Objective:   Physical Exam Neck is supple.  No adenopathy.  Pharynx very slightly injected.  TMs are clear.  Rapid strep screen and rapid flu test are negative.  Dipstick UA is negative.  Chest is clear to auscultation without rales or wheezing.       Assessment & Plan:  Flulike illness with negative flu and rapid strep screens  Plan: Tamiflu 75 mg twice daily for 5 days.  Urine specimen is normal.  CBC drawn.  Will follow up with telephone call tomorrow.  Addendum: Called patient Friday, March 22.  White blood cell count is elevated at 13,300.  He is feeling a little bit better but still congested.  He will have chest x-ray today.

## 2017-03-25 NOTE — Telephone Encounter (Signed)
CXR reveals bilateral infiltrates. Start Doxycycline 100 mg bid x 10 days and follow up 7-10 days.Message left.

## 2017-03-25 NOTE — Progress Notes (Deleted)
   Subjective:    Patient ID: Jeffrey Wu., male    DOB: 1945/09/30, 72 y.o.   MRN: [id number]  HPI     Review of Systems     Objective:   Physical Exam        Assessment & Plan:

## 2017-03-31 ENCOUNTER — Encounter: Payer: Self-pay | Admitting: Internal Medicine

## 2017-03-31 ENCOUNTER — Ambulatory Visit (INDEPENDENT_AMBULATORY_CARE_PROVIDER_SITE_OTHER): Payer: Medicare Other | Admitting: Internal Medicine

## 2017-03-31 VITALS — BP 120/60 | HR 84 | Temp 97.9°F | Ht 71.0 in | Wt 198.0 lb

## 2017-03-31 DIAGNOSIS — J189 Pneumonia, unspecified organism: Secondary | ICD-10-CM

## 2017-03-31 NOTE — Patient Instructions (Signed)
Rest and drink plenty of fluids.  Finish course of Tamiflu and doxycycline.  Have repeat chest x-ray in approximately 3 weeks

## 2017-03-31 NOTE — Progress Notes (Signed)
   Subjective:    Patient ID: Jeffrey Wu., male    DOB: June 21, 1945, 72 y.o.   MRN: [id number]  HPI H ere today to follow-up on abnormal chest x-ray thought to be community-acquired pneumonia.  Fever and shaking chills have resolved.  He is on Tamiflu and doxycycline.  Has developed some more issues with allergies recently.  He may take some prednisone which he has been instructed to do by his allergist.  No productive cough just nasal congestion.    Review of Systems see above     Objective:   Physical Exam Skin warm and dry.  Nodes none.  Chest is clear to auscultation today.       Assessment & Plan:  Community-acquired pneumonia  Chronic allergic rhinitis  Plan: Finish course of Tamiflu and doxycycline.  Follow-up chest x-ray in approximately 3 weeks.  Rest and drink plenty of fluids.

## 2017-04-04 ENCOUNTER — Encounter: Payer: Self-pay | Admitting: Physician Assistant

## 2017-04-18 ENCOUNTER — Ambulatory Visit
Admission: RE | Admit: 2017-04-18 | Discharge: 2017-04-18 | Disposition: A | Discharge status: Home / Self Care | Payer: Medicare Other | Source: Ambulatory Visit | Attending: Internal Medicine | Admitting: Internal Medicine

## 2017-04-18 ENCOUNTER — Other Ambulatory Visit: Payer: Self-pay | Admitting: Internal Medicine

## 2017-04-18 DIAGNOSIS — J189 Pneumonia, unspecified organism: Secondary | ICD-10-CM

## 2017-04-19 ENCOUNTER — Telehealth: Payer: Self-pay | Admitting: Internal Medicine

## 2017-04-19 NOTE — Telephone Encounter (Signed)
Jeffrey Wu Self [phone]  [name] called to check on the results of his Xray from yesterday.

## 2017-04-19 NOTE — Telephone Encounter (Signed)
Called Mr Blanke and gave him results per Dr Renold Genta and let him know results are in my chart for his review.

## 2017-04-19 NOTE — Telephone Encounter (Signed)
Jeffrey Wu, his CXR is negative- pneumonia has cleared up. and is available on [facility] for his review.

## 2017-08-03 ENCOUNTER — Other Ambulatory Visit: Payer: Self-pay | Admitting: Internal Medicine

## 2017-08-03 DIAGNOSIS — Z8659 Personal history of other mental and behavioral disorders: Secondary | ICD-10-CM

## 2017-08-03 DIAGNOSIS — N400 Enlarged prostate without lower urinary tract symptoms: Secondary | ICD-10-CM

## 2017-08-03 DIAGNOSIS — G609 Hereditary and idiopathic neuropathy, unspecified: Secondary | ICD-10-CM

## 2017-08-03 DIAGNOSIS — R7302 Impaired glucose tolerance (oral): Secondary | ICD-10-CM

## 2017-08-03 DIAGNOSIS — Z0000 Encounter for general adult medical examination without abnormal findings: Secondary | ICD-10-CM

## 2017-08-03 DIAGNOSIS — E785 Hyperlipidemia, unspecified: Secondary | ICD-10-CM

## 2017-08-05 ENCOUNTER — Other Ambulatory Visit: Payer: Medicare Other | Admitting: Internal Medicine

## 2017-08-05 DIAGNOSIS — E785 Hyperlipidemia, unspecified: Secondary | ICD-10-CM

## 2017-08-05 DIAGNOSIS — Z8659 Personal history of other mental and behavioral disorders: Secondary | ICD-10-CM

## 2017-08-05 DIAGNOSIS — Z0000 Encounter for general adult medical examination without abnormal findings: Secondary | ICD-10-CM

## 2017-08-05 DIAGNOSIS — G609 Hereditary and idiopathic neuropathy, unspecified: Secondary | ICD-10-CM

## 2017-08-05 DIAGNOSIS — N400 Enlarged prostate without lower urinary tract symptoms: Secondary | ICD-10-CM

## 2017-08-05 DIAGNOSIS — R7302 Impaired glucose tolerance (oral): Secondary | ICD-10-CM

## 2017-08-06 LAB — CBC WITH DIFFERENTIAL/PLATELET
Basophils Absolute: 39 cells/uL (ref 0–200)
Basophils Relative: 0.3 %
EOS PCT: 3.3 %
Eosinophils Absolute: 426 cells/uL (ref 15–500)
HEMATOCRIT: 47.9 % (ref 38.5–50.0)
HEMOGLOBIN: 15.9 g/dL (ref 13.2–17.1)
LYMPHS ABS: 1574 {cells}/uL (ref 850–3900)
MCH: 30.9 pg (ref 27.0–33.0)
MCHC: 33.2 g/dL (ref 32.0–36.0)
MCV: 93.2 fL (ref 80.0–100.0)
MPV: 10.4 fL (ref 7.5–12.5)
Monocytes Relative: 6.9 %
NEUTROS ABS: 9972 {cells}/uL — AB (ref 1500–7800)
NEUTROS PCT: 77.3 %
Platelets: 256 10*3/uL (ref 140–400)
RBC: 5.14 10*6/uL (ref 4.20–5.80)
RDW: 11.9 % (ref 11.0–15.0)
Total Lymphocyte: 12.2 %
WBC mixed population: 890 cells/uL (ref 200–950)
WBC: 12.9 10*3/uL — AB (ref 3.8–10.8)

## 2017-08-06 LAB — COMPLETE METABOLIC PANEL WITH GFR
AG Ratio: 1.5 (calc) (ref 1.0–2.5)
ALKALINE PHOSPHATASE (APISO): 83 U/L (ref 40–115)
ALT: 17 U/L (ref 9–46)
AST: 17 U/L (ref 10–35)
Albumin: 4 g/dL (ref 3.6–5.1)
BILIRUBIN TOTAL: 0.4 mg/dL (ref 0.2–1.2)
BUN: 22 mg/dL (ref 7–25)
CO2: 30 mmol/L (ref 20–32)
Calcium: 9.6 mg/dL (ref 8.6–10.3)
Chloride: 100 mmol/L (ref 98–110)
Creat: 1.06 mg/dL (ref 0.70–1.18)
GFR, Est African American: 80 mL/min/{1.73_m2} (ref >=60)
GFR, Est Non African American: 69 mL/min/{1.73_m2} (ref >=60)
Globulin: 2.6 g/dL (calc) (ref 1.9–3.7)
Glucose, Bld: 94 mg/dL (ref 65–99)
Potassium: 4.6 mmol/L (ref 3.5–5.3)
Sodium: 137 mmol/L (ref 135–146)
Total Protein: 6.6 g/dL (ref 6.1–8.1)

## 2017-08-06 LAB — MICROALBUMIN / CREATININE URINE RATIO
CREATININE, URINE: 121 mg/dL (ref 20–320)
MICROALB UR: 0.3 mg/dL
Microalb Creat Ratio: 2 mcg/mg creat (ref <30)

## 2017-08-06 LAB — HEMOGLOBIN A1C
Hgb A1c MFr Bld: 6.1 % of total Hgb — ABNORMAL HIGH (ref <5.7)
MEAN PLASMA GLUCOSE: 128 (calc)
eAG (mmol/L): 7.1 (calc)

## 2017-08-06 LAB — LIPID PANEL
Cholesterol: 216 mg/dL — ABNORMAL HIGH (ref <200)
HDL: 68 mg/dL (ref >40)
LDL CHOLESTEROL (CALC): 123 mg/dL — AB
NON-HDL CHOLESTEROL (CALC): 148 mg/dL — AB (ref <130)
TRIGLYCERIDES: 139 mg/dL (ref <150)
Total CHOL/HDL Ratio: 3.2 (calc) (ref <5.0)

## 2017-08-06 LAB — PSA: PSA: 0.4 ng/mL (ref <=4.0)

## 2017-08-08 ENCOUNTER — Encounter: Payer: Self-pay | Admitting: Internal Medicine

## 2017-08-08 ENCOUNTER — Ambulatory Visit
Admission: RE | Admit: 2017-08-08 | Discharge: 2017-08-08 | Disposition: A | Discharge status: Home / Self Care | Payer: Medicare Other | Source: Ambulatory Visit | Attending: Internal Medicine | Admitting: Internal Medicine

## 2017-08-08 ENCOUNTER — Ambulatory Visit (INDEPENDENT_AMBULATORY_CARE_PROVIDER_SITE_OTHER): Payer: Medicare Other | Admitting: Internal Medicine

## 2017-08-08 VITALS — BP 120/62 | HR 72 | Ht 71.0 in | Wt 187.0 lb

## 2017-08-08 DIAGNOSIS — F39 Unspecified mood [affective] disorder: Secondary | ICD-10-CM

## 2017-08-08 DIAGNOSIS — D692 Other nonthrombocytopenic purpura: Secondary | ICD-10-CM

## 2017-08-08 DIAGNOSIS — G609 Hereditary and idiopathic neuropathy, unspecified: Secondary | ICD-10-CM

## 2017-08-08 DIAGNOSIS — J1289 Other viral pneumonia: Secondary | ICD-10-CM

## 2017-08-08 DIAGNOSIS — B029 Zoster without complications: Secondary | ICD-10-CM

## 2017-08-08 DIAGNOSIS — K219 Gastro-esophageal reflux disease without esophagitis: Secondary | ICD-10-CM

## 2017-08-08 DIAGNOSIS — R7302 Impaired glucose tolerance (oral): Secondary | ICD-10-CM

## 2017-08-08 DIAGNOSIS — E7800 Pure hypercholesterolemia, unspecified: Secondary | ICD-10-CM

## 2017-08-08 DIAGNOSIS — G4733 Obstructive sleep apnea (adult) (pediatric): Secondary | ICD-10-CM

## 2017-08-08 DIAGNOSIS — J189 Pneumonia, unspecified organism: Secondary | ICD-10-CM

## 2017-08-08 DIAGNOSIS — F339 Major depressive disorder, recurrent, unspecified: Secondary | ICD-10-CM

## 2017-08-08 DIAGNOSIS — K649 Unspecified hemorrhoids: Secondary | ICD-10-CM

## 2017-08-08 DIAGNOSIS — E785 Hyperlipidemia, unspecified: Secondary | ICD-10-CM | POA: Diagnosis not present

## 2017-08-08 DIAGNOSIS — Z0000 Encounter for general adult medical examination without abnormal findings: Secondary | ICD-10-CM

## 2017-08-08 DIAGNOSIS — N400 Enlarged prostate without lower urinary tract symptoms: Secondary | ICD-10-CM

## 2017-08-08 DIAGNOSIS — Z8659 Personal history of other mental and behavioral disorders: Secondary | ICD-10-CM

## 2017-08-08 LAB — POCT URINALYSIS DIPSTICK
APPEARANCE: NORMAL
BILIRUBIN UA: NEGATIVE
Blood, UA: NEGATIVE
Glucose, UA: NEGATIVE
Ketones, UA: NEGATIVE
LEUKOCYTES UA: NEGATIVE
Nitrite, UA: NEGATIVE
ODOR: NORMAL
PH UA: 6 (ref 5.0–8.0)
Protein, UA: NEGATIVE
Spec Grav, UA: 1.015 (ref 1.010–1.025)
Urobilinogen, UA: 0.2 E.U./dL

## 2017-08-08 NOTE — Patient Instructions (Signed)
CXR shows no residual pneumonia. Urology consult. RTC 6 months. Continue same medications.

## 2017-08-08 NOTE — Progress Notes (Signed)
Subjective:    Patient ID: Jeffrey Wu., male    DOB: May 17, 1945, 72 y.o.   MRN: [id number]  HPI 72 year old Male for Medicare wellness, Health maintenace exam and evaluation of medical issues.  Seen in [place] July 15 treated with Zyrtec D. Did not improve. Was diagnosed with pneumonia in [place] and placed on Levaquin. Has finished this. Feels better.Needs follow up CXR. Had one night of fever and chills with a lot of discolored sputum. No records from [place] in [employer].  History of recurrent sinusitis and allergic rhinitis treated at [facility] and is on chronic low-dose prednisone therapy daily.  He had aspirin the sensitivity procedure done there as well.  History of sleep apnea and uses CPAP machine.  History of BPH and hyperlipidemia.  History of depression.  Past medical history: Appendectomy 1955.  Thyroglossal duct cyst removed 1990.  Hernia repair 2009.  Social history: He is married.  Wife is a retired Land.  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  He quit smoking several years ago but formally smoked a pack of cigarettes daily for some 46 years.  Consumes 3 ounces of alcohol daily.  Family history: Father died at age 23 of leukemia and patient was 72 years old at the time.  Mother died at age 64 of a stroke after heart surgery.  One brother and one sister in good health.  3 adult children 2 sons and a daughter all of whom are in good health.    Review of Systems More issues with prostate. Needs to go to urologist. Has frequency dribbling and hesitancy with decreased flow not responding to Flomax. Urine dipstick normal.  Has mildly elevated AIC and LDL.     Objective:   Physical Exam  Constitutional: He is oriented to person, place, and time. He appears well-developed and well-nourished. No distress.  HENT:  Head: Normocephalic and atraumatic.  Right Ear: External ear normal.  Left Ear: External ear normal.    Mouth/Throat: Oropharynx is clear and moist.  Eyes: Pupils are equal, round, and reactive to light. Conjunctivae and EOM are normal. Right eye exhibits no discharge. Left eye exhibits no discharge. No scleral icterus.  Neck: Neck supple. No JVD present. No thyromegaly present.  Cardiovascular: Normal rate, regular rhythm and normal heart sounds.  No murmur heard. Pulmonary/Chest: Effort normal. No respiratory distress. He has no wheezes. He has no rales.  Abdominal: Soft. He exhibits no distension and no mass. There is no tenderness. There is no rebound and no guarding.  Genitourinary:  Genitourinary Comments: Prostate enlarged without nodules  Musculoskeletal: He exhibits no edema or deformity.  Lymphadenopathy:    He has no cervical adenopathy.  Neurological: He is oriented to person, place, and time. He displays normal reflexes. No cranial nerve deficit. Coordination normal.  Skin: Skin is warm and dry. No rash noted. He is not diaphoretic.  Psychiatric: He has a normal mood and affect. His behavior is normal. Judgment and thought content normal.  Vitals reviewed.         Assessment & Plan:  Resolving pneumonia which developed on trip out [place].  Have repeat chest x-ray today.  Issues with BPH-needs to see urologist-not responding well to Flomax  Sleep apnea treated with CPAP  History of recurrent sinusitis  Impaired glucose tolerance-hemoglobin A1c increased from 5.9% 6.1%  History of aspirin sensitivity status post desensitization procedure  Chronic prednisone therapy per allergist at  [facility]  History of depression-stable on treatment  Pure hypercholesterolemia-watch diet will not change dose at the present time  History of idiopathic peripheral neuropathy  Senile purpura  History of hemorrhoids treated with steroid-containing cream  History of herpes zoster treated with Valtrex  GE reflux stable with PPI  Plan: Continue same  medications and return in 6 months.  Have follow-up chest x-ray today.  Allergic rhinitis followed at [facility]  Peripheral neuropathy  Hyperlipidemia treated with lipid medication.  Total cholesterol 216 and previously was 229 a year ago.  LDL cholesterol 123 and was 115 last year.  Watch diet and try to get some exercise.  Just coming home from a trip out [place]  Senile purpura due to prednisone therapy  GE reflux treated with Prilosec  Plan: Return in 6 months or as needed.  To see urologist in the near future.  Subjective:   Patient presents for Medicare Annual/Subsequent preventive examination.  Review Past Medical/Family/Social: See above   Risk Factors  Current exercise habits: Walks, hunting,fishing Dietary issues discussed: Low-fat low carbohydrate  Cardiac risk factors: Hyperlipidemia ;family history of stroke  Depression Screen  (Note: if answer to either of the following is "Yes", a more complete depression screening is indicated)   Over the past two weeks, have you felt down, depressed or hopeless? No  Over the past two weeks, have you felt little interest or pleasure in doing things? No Have you lost interest or pleasure in daily life? No Do you often feel hopeless? No Do you cry easily over simple problems? No   Activities of Daily Living  In your present state of health, do you have any difficulty performing the following activities?:   Driving? No  Managing money? No  Feeding yourself? No  Getting from bed to chair? No  Climbing a flight of stairs? No  Preparing food and eating?: No  Bathing or showering? No  Getting dressed: No  Getting to the toilet? No  Using the toilet:No  Moving around from place to place: No  In the past year have you fallen or had a near fall?:No  Are you sexually active? No  Do you have more than one partner? No   Hearing Difficulties: No  Do you often ask people to speak up or repeat themselves?   Yes Do you experience ringing or noises in your ears?  Yes Do you have difficulty understanding soft or whispered voices?  Yes Do you feel that you have a problem with memory? No Do you often misplace items? No    Home Safety:  Do you have a smoke alarm at your residence? Yes Do you have grab bars in the bathroom?  Yes Do you have throw rugs in your house?  Yes   Cognitive Testing  Alert? Yes Normal Appearance?Yes  Oriented to person? Yes Place? Yes  Time? Yes  Recall of three objects? Yes  Can perform simple calculations? Yes  Displays appropriate judgment?Yes  Can read the correct time from a watch face?Yes   List the Names of Other Physician/Practitioners you currently use:  See referral list for the physicians patient is currently seeing.     Review of Systems: See above   Objective:     General appearance: Appears younger than stated age Head: Normocephalic, without obvious abnormality, atraumatic  Eyes: conj clear, EOMi PEERLA  Ears: normal TM's and external ear canals both ears  Nose: Nares normal. Septum midline. Mucosa  normal. No drainage or sinus tenderness.  Throat: lips, mucosa, and tongue normal; teeth and gums normal  Neck: no adenopathy, no carotid bruit, no JVD, supple, symmetrical, trachea midline and thyroid not enlarged, symmetric, no tenderness/mass/nodules  No CVA tenderness.  Lungs: clear to auscultation bilaterally  Breasts: normal appearance, no masses or tenderness Heart: regular rate and rhythm, S1, S2 normal, no murmur, click, rub or gallop  Abdomen: soft, non-tender; bowel sounds normal; no masses, no organomegaly  Musculoskeletal: ROM normal in all joints, no crepitus, no deformity, Normal muscle strengthen. Back  is symmetric, no curvature. Skin: Skin color, texture, turgor normal. No rashes or lesions  Lymph nodes: Cervical, supraclavicular, and axillary nodes normal.  Neurologic: CN 2 -12 Normal, Normal symmetric reflexes. Normal  coordination and gait  Psych: Alert & Oriented x 3, Mood appear stable.    Assessment:    Annual wellness medicare exam   Plan:    During the course of the visit the patient was educated and counseled about appropriate screening and preventive services including:   Annual flu vaccine     Patient Instructions (the written plan) was given to the patient.  Medicare Attestation  I have personally reviewed:  The patient's medical and social history  Their use of alcohol, tobacco or illicit drugs  Their current medications and supplements  The patient's functional ability including ADLs,fall risks, home safety risks, cognitive, and hearing and visual impairment  Diet and physical activities  Evidence for depression or mood disorders  The patient's weight, height, BMI, and visual acuity have been recorded in the chart. I have made referrals, counseling, and provided education to the patient based on review of the above and I have provided the patient with a written personalized care plan for preventive services.

## 2017-08-31 ENCOUNTER — Encounter: Payer: Self-pay | Admitting: Internal Medicine

## 2018-01-18 ENCOUNTER — Other Ambulatory Visit: Payer: Self-pay | Admitting: Internal Medicine

## 2018-02-07 ENCOUNTER — Other Ambulatory Visit: Payer: Medicare PPO | Admitting: Internal Medicine

## 2018-02-07 DIAGNOSIS — E785 Hyperlipidemia, unspecified: Secondary | ICD-10-CM

## 2018-02-07 DIAGNOSIS — R7302 Impaired glucose tolerance (oral): Secondary | ICD-10-CM

## 2018-02-08 LAB — HEPATIC FUNCTION PANEL
AG Ratio: 1.7 (calc) (ref 1.0–2.5)
ALKALINE PHOSPHATASE (APISO): 66 U/L (ref 35–144)
ALT: 17 U/L (ref 9–46)
AST: 20 U/L (ref 10–35)
Albumin: 3.7 g/dL (ref 3.6–5.1)
Bilirubin, Direct: 0.1 mg/dL (ref 0.0–0.2)
Globulin: 2.2 g/dL (calc) (ref 1.9–3.7)
Indirect Bilirubin: 0.4 mg/dL (calc) (ref 0.2–1.2)
Total Bilirubin: 0.5 mg/dL (ref 0.2–1.2)
Total Protein: 5.9 g/dL — ABNORMAL LOW (ref 6.1–8.1)

## 2018-02-08 LAB — LIPID PANEL
Cholesterol: 206 mg/dL — ABNORMAL HIGH (ref <200)
HDL: 82 mg/dL (ref >=40)
LDL Cholesterol (Calc): 105 mg/dL (calc) — ABNORMAL HIGH
Non-HDL Cholesterol (Calc): 124 mg/dL (calc) (ref <130)
Total CHOL/HDL Ratio: 2.5 (calc) (ref <5.0)
Triglycerides: 91 mg/dL (ref <150)

## 2018-02-08 LAB — HEMOGLOBIN A1C
Hgb A1c MFr Bld: 6 % of total Hgb — ABNORMAL HIGH (ref <5.7)
Mean Plasma Glucose: 126 (calc)
eAG (mmol/L): 7 (calc)

## 2018-02-08 LAB — MICROALBUMIN / CREATININE URINE RATIO
Creatinine, Urine: 169 mg/dL (ref 20–320)
Microalb Creat Ratio: 2 mcg/mg creat (ref <30)
Microalb, Ur: 0.3 mg/dL

## 2018-02-10 ENCOUNTER — Ambulatory Visit (INDEPENDENT_AMBULATORY_CARE_PROVIDER_SITE_OTHER): Payer: Medicare PPO | Admitting: Internal Medicine

## 2018-02-10 ENCOUNTER — Encounter: Payer: Self-pay | Admitting: Internal Medicine

## 2018-02-10 VITALS — BP 120/70 | HR 56 | Temp 98.6°F | Ht 70.0 in | Wt 194.0 lb

## 2018-02-10 DIAGNOSIS — J4520 Mild intermittent asthma, uncomplicated: Secondary | ICD-10-CM | POA: Diagnosis not present

## 2018-02-10 DIAGNOSIS — R7302 Impaired glucose tolerance (oral): Secondary | ICD-10-CM | POA: Diagnosis not present

## 2018-02-10 DIAGNOSIS — K219 Gastro-esophageal reflux disease without esophagitis: Secondary | ICD-10-CM

## 2018-02-10 DIAGNOSIS — E7800 Pure hypercholesterolemia, unspecified: Secondary | ICD-10-CM | POA: Diagnosis not present

## 2018-02-10 DIAGNOSIS — F39 Unspecified mood [affective] disorder: Secondary | ICD-10-CM

## 2018-02-10 NOTE — Patient Instructions (Signed)
It was a pleasure to see you today.  Keep up the good work with glucose control and lipid control.  Follow-up in 6 months for physical exam.

## 2018-02-10 NOTE — Progress Notes (Signed)
   Subjective:    Patient ID: Jeffrey Wu., male    DOB: [birth year], 73 y.o.   MRN: [id number]  HPI 73 year old Male in today for 14-month recheck.  History of allergic rhinitis and steroid-dependent asthma.  He is now on prednisone 1 mg a day, Dupixent every 2 weeks, Singulair, Allegra, Pulmicort rinses, Dulera.  Hemoglobin A1c is 6% and in August was 6.1%.  However he has reduced his dose of prednisone.  Total cholesterol is 206 and in August was 216.  LDL is 105 and in August was 123.  Triglycerides have decreased from 139 to 91  Perhaps reducing dose of steroid has helped lipids and he has been watching his diet.  Plans to go to the [employer] for a bird watching trip in the near future and then the summer he will be going to [place].  He will need some antibiotics to take with him for [place] trip      Review of Systems no new complaints     Objective:   Physical Exam Skin warm and dry.  Nodes none.  Neck is supple without JVD thyromegaly or carotid bruits.  Chest clear to auscultation.  Cardiac exam regular rate and rhythm normal S1 and S2.  Extremities without edema.       Assessment & Plan:  Impaired glucose tolerance-stable on current regimen  Hyperlipidemia-improved with decreasing steroids and medication-currently taking Crestor 10 mg daily.  Allergic rhinitis and steroid-dependent asthma continues to be followed at [facility].  Continue current medications  BPH-currently not on medication  Mood disorder-stable on Wellbutrin  History of sleep apnea  History of peripheral neuropathy  Plan: He will follow-up August 2020 for physical exam and continue current medications.

## 2018-02-22 ENCOUNTER — Other Ambulatory Visit: Payer: Self-pay | Admitting: Internal Medicine

## 2018-03-15 ENCOUNTER — Other Ambulatory Visit: Payer: Self-pay | Admitting: Internal Medicine

## 2018-04-26 DIAGNOSIS — J324 Chronic pansinusitis: Secondary | ICD-10-CM | POA: Diagnosis not present

## 2018-04-26 DIAGNOSIS — J989 Respiratory disorder, unspecified: Secondary | ICD-10-CM | POA: Diagnosis not present

## 2018-04-26 DIAGNOSIS — J45998 Other asthma: Secondary | ICD-10-CM | POA: Diagnosis not present

## 2018-04-26 DIAGNOSIS — Z7952 Long term (current) use of systemic steroids: Secondary | ICD-10-CM | POA: Diagnosis not present

## 2018-04-26 DIAGNOSIS — Z79899 Other long term (current) drug therapy: Secondary | ICD-10-CM | POA: Diagnosis not present

## 2018-04-26 DIAGNOSIS — Z7289 Other problems related to lifestyle: Secondary | ICD-10-CM | POA: Diagnosis not present

## 2018-04-26 DIAGNOSIS — T39015A Adverse effect of aspirin, initial encounter: Secondary | ICD-10-CM | POA: Diagnosis not present

## 2018-04-26 DIAGNOSIS — D721 Eosinophilia: Secondary | ICD-10-CM | POA: Diagnosis not present

## 2018-04-26 DIAGNOSIS — J4550 Severe persistent asthma, uncomplicated: Secondary | ICD-10-CM | POA: Diagnosis not present

## 2018-04-26 DIAGNOSIS — Z87891 Personal history of nicotine dependence: Secondary | ICD-10-CM | POA: Diagnosis not present

## 2018-06-19 ENCOUNTER — Other Ambulatory Visit: Payer: Self-pay

## 2018-06-19 DIAGNOSIS — J1289 Other viral pneumonia: Secondary | ICD-10-CM

## 2018-06-19 MED ORDER — DULERA 100-5 MCG/ACT IN AERO: INHALATION_SPRAY | RESPIRATORY_TRACT | 11 refills | Status: DC

## 2018-07-26 DIAGNOSIS — R768 Other specified abnormal immunological findings in serum: Secondary | ICD-10-CM | POA: Diagnosis not present

## 2018-07-26 DIAGNOSIS — J45998 Other asthma: Secondary | ICD-10-CM | POA: Diagnosis not present

## 2018-07-26 DIAGNOSIS — J324 Chronic pansinusitis: Secondary | ICD-10-CM | POA: Diagnosis not present

## 2018-07-26 DIAGNOSIS — Z87891 Personal history of nicotine dependence: Secondary | ICD-10-CM | POA: Diagnosis not present

## 2018-07-26 DIAGNOSIS — J984 Other disorders of lung: Secondary | ICD-10-CM | POA: Diagnosis not present

## 2018-07-26 DIAGNOSIS — Z7952 Long term (current) use of systemic steroids: Secondary | ICD-10-CM | POA: Diagnosis not present

## 2018-07-26 DIAGNOSIS — J45909 Unspecified asthma, uncomplicated: Secondary | ICD-10-CM | POA: Diagnosis not present

## 2018-07-26 DIAGNOSIS — H90A32 Mixed conductive and sensorineural hearing loss, unilateral, left ear with restricted hearing on the contralateral side: Secondary | ICD-10-CM | POA: Diagnosis not present

## 2018-07-26 DIAGNOSIS — T39015A Adverse effect of aspirin, initial encounter: Secondary | ICD-10-CM | POA: Diagnosis not present

## 2018-08-08 ENCOUNTER — Other Ambulatory Visit: Payer: Medicare PPO | Admitting: Internal Medicine

## 2018-08-08 ENCOUNTER — Other Ambulatory Visit: Payer: Self-pay

## 2018-08-08 DIAGNOSIS — N400 Enlarged prostate without lower urinary tract symptoms: Secondary | ICD-10-CM

## 2018-08-08 DIAGNOSIS — B029 Zoster without complications: Secondary | ICD-10-CM | POA: Diagnosis not present

## 2018-08-08 DIAGNOSIS — Z0000 Encounter for general adult medical examination without abnormal findings: Secondary | ICD-10-CM

## 2018-08-08 DIAGNOSIS — G4730 Sleep apnea, unspecified: Secondary | ICD-10-CM | POA: Diagnosis not present

## 2018-08-08 DIAGNOSIS — E7800 Pure hypercholesterolemia, unspecified: Secondary | ICD-10-CM

## 2018-08-08 DIAGNOSIS — F339 Major depressive disorder, recurrent, unspecified: Secondary | ICD-10-CM

## 2018-08-08 DIAGNOSIS — F39 Unspecified mood [affective] disorder: Secondary | ICD-10-CM

## 2018-08-08 DIAGNOSIS — R7302 Impaired glucose tolerance (oral): Secondary | ICD-10-CM

## 2018-08-08 DIAGNOSIS — K219 Gastro-esophageal reflux disease without esophagitis: Secondary | ICD-10-CM

## 2018-08-09 LAB — LIPID PANEL
Cholesterol: 188 mg/dL (ref <200)
HDL: 77 mg/dL (ref >=40)
LDL Cholesterol (Calc): 97 mg/dL (calc)
Non-HDL Cholesterol (Calc): 111 mg/dL (calc) (ref <130)
Total CHOL/HDL Ratio: 2.4 (calc) (ref <5.0)
Triglycerides: 54 mg/dL (ref <150)

## 2018-08-09 LAB — COMPLETE METABOLIC PANEL WITH GFR
AG Ratio: 1.6 (calc) (ref 1.0–2.5)
ALT: 16 U/L (ref 9–46)
AST: 19 U/L (ref 10–35)
Albumin: 3.9 g/dL (ref 3.6–5.1)
Alkaline phosphatase (APISO): 75 U/L (ref 35–144)
BUN: 17 mg/dL (ref 7–25)
CO2: 28 mmol/L (ref 20–32)
Calcium: 9.2 mg/dL (ref 8.6–10.3)
Chloride: 103 mmol/L (ref 98–110)
Creat: 0.89 mg/dL (ref 0.70–1.18)
GFR, Est African American: 98 mL/min/{1.73_m2} (ref >=60)
GFR, Est Non African American: 84 mL/min/{1.73_m2} (ref >=60)
Globulin: 2.4 g/dL (calc) (ref 1.9–3.7)
Glucose, Bld: 93 mg/dL (ref 65–99)
Potassium: 4.3 mmol/L (ref 3.5–5.3)
Sodium: 138 mmol/L (ref 135–146)
Total Bilirubin: 0.6 mg/dL (ref 0.2–1.2)
Total Protein: 6.3 g/dL (ref 6.1–8.1)

## 2018-08-09 LAB — HEMOGLOBIN A1C
Hgb A1c MFr Bld: 5.8 % of total Hgb — ABNORMAL HIGH (ref <5.7)
Mean Plasma Glucose: 120 (calc)
eAG (mmol/L): 6.6 (calc)

## 2018-08-09 LAB — CBC WITH DIFFERENTIAL/PLATELET
Absolute Monocytes: 610 cells/uL (ref 200–950)
Basophils Absolute: 27 cells/uL (ref 0–200)
Basophils Relative: 0.4 %
Eosinophils Absolute: 657 cells/uL — ABNORMAL HIGH (ref 15–500)
Eosinophils Relative: 9.8 %
HCT: 44.7 % (ref 38.5–50.0)
Hemoglobin: 14.7 g/dL (ref 13.2–17.1)
Lymphs Abs: 1688 cells/uL (ref 850–3900)
MCH: 31.7 pg (ref 27.0–33.0)
MCHC: 32.9 g/dL (ref 32.0–36.0)
MCV: 96.5 fL (ref 80.0–100.0)
MPV: 11.7 fL (ref 7.5–12.5)
Monocytes Relative: 9.1 %
Neutro Abs: 3719 cells/uL (ref 1500–7800)
Neutrophils Relative %: 55.5 %
Platelets: 168 10*3/uL (ref 140–400)
RBC: 4.63 10*6/uL (ref 4.20–5.80)
RDW: 11.9 % (ref 11.0–15.0)
Total Lymphocyte: 25.2 %
WBC: 6.7 10*3/uL (ref 3.8–10.8)

## 2018-08-11 ENCOUNTER — Other Ambulatory Visit: Payer: Self-pay

## 2018-08-11 ENCOUNTER — Encounter: Payer: Self-pay | Admitting: Internal Medicine

## 2018-08-11 ENCOUNTER — Ambulatory Visit (INDEPENDENT_AMBULATORY_CARE_PROVIDER_SITE_OTHER): Payer: Medicare PPO | Admitting: Internal Medicine

## 2018-08-11 VITALS — BP 110/70 | HR 66 | Temp 98.3°F | Ht 70.0 in | Wt 182.0 lb

## 2018-08-11 DIAGNOSIS — N400 Enlarged prostate without lower urinary tract symptoms: Secondary | ICD-10-CM | POA: Diagnosis not present

## 2018-08-11 DIAGNOSIS — Z0000 Encounter for general adult medical examination without abnormal findings: Secondary | ICD-10-CM | POA: Diagnosis not present

## 2018-08-11 DIAGNOSIS — G4733 Obstructive sleep apnea (adult) (pediatric): Secondary | ICD-10-CM | POA: Diagnosis not present

## 2018-08-11 DIAGNOSIS — E7800 Pure hypercholesterolemia, unspecified: Secondary | ICD-10-CM | POA: Diagnosis not present

## 2018-08-11 DIAGNOSIS — G609 Hereditary and idiopathic neuropathy, unspecified: Secondary | ICD-10-CM

## 2018-08-11 DIAGNOSIS — K219 Gastro-esophageal reflux disease without esophagitis: Secondary | ICD-10-CM

## 2018-08-11 DIAGNOSIS — F39 Unspecified mood [affective] disorder: Secondary | ICD-10-CM | POA: Diagnosis not present

## 2018-08-11 DIAGNOSIS — Z8659 Personal history of other mental and behavioral disorders: Secondary | ICD-10-CM

## 2018-08-11 DIAGNOSIS — R7302 Impaired glucose tolerance (oral): Secondary | ICD-10-CM

## 2018-08-11 DIAGNOSIS — Z8669 Personal history of other diseases of the nervous system and sense organs: Secondary | ICD-10-CM

## 2018-08-11 DIAGNOSIS — D692 Other nonthrombocytopenic purpura: Secondary | ICD-10-CM

## 2018-08-11 LAB — PSA: PSA: 0.3 ng/mL (ref <=4.0)

## 2018-08-11 NOTE — Progress Notes (Signed)
Subjective:    Patient ID: Jeffrey Wu., male    DOB: [birth year], 73 y.o.   MRN: [id number]  HPI 73 year old Male for health maintenance exam, Medicare wellness and evaluation of medical issues.  History of pneumonia while traveling in [place] in 2019.  History of recurrent sinusitis and allergic rhinitis treated at [facility].  He had aspirin the sensitivity procedure done there as well.  History of sleep apnea uses CPAP machine.  History of BPH and hyperlipidemia.  History of depression.  Past medical history: Appendectomy 1955 thyroglossal duct cyst removed 1990.  Hernia repair 2009.  Social history: He is married.  Wife is a retired Land.  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  Quit smoking several years ago but formally smoked a pack of cigarettes daily for some 46 years.  Consumes alcohol daily.  Family history: Father died at age 53 of leukemia when patient was 14 years old.  Mother died at age 30 of a stroke after heart surgery.  One brother and 1 sister in good health.  3 adult children-2 sons and a daughter all of whom are in good health.  In 2015 he had surgery at [facility] bilateral feet NSAIDs.  He had an improvement in his sense of smell and decreased nasal discharge.  He then was on a prolonged steroid taper.  In 2016 he had complete loss of smell and increased nasal congestion despite aspirin therapy nasal steroid and leukotriene inhibitor.  Endoscopy of the sinuses revealed ethmoid polypoid edema and eosinophilic-like secretions that were tenacious.  He underwent a repeat oral steroid burst and began prednisone 5 mg daily.  In Fall 2019 he had bilateral ethmoidectomies frontal sinusotomies and sphenoidotomies performed.  Followed by pulmonology at [facility].  He is on Dupixent every 2 weeks.  Has sensorineural hearing loss of right ear and restricted hearing on  the left.  He had onset of his asthma at age 52.  He has impaired glucose tolerance with hemoglobin A1c 5.8%.  Lipid panel is normal.  CBC normal except for elevated eosinophil count.  PSA is normal.  Review of Systems history of prostatism treated with dutasteride -tamsulosin 0.5-0.4 mg capsule  For asthma he is maintained on  Dulera, Allegra, and Singulair.  For depression he takes Wellbutrin.  For GE reflux he takes Prilosec  He is on Crestor 10 mg daily.  History of sleep apnea not wearing CPAP.         Objective:   Physical Exam Blood pressure 110/70 pulse 66 temperature 98.3 degrees weight 182 pounds.  BMI 26.11  Skin warm and dry.  Nodes none.  TMs and pharynx clear.  Neck is supple without JVD thyromegaly or carotid bruits.  Chest clear.  Cardiac exam regular rate and rhythm.  Abdomen no hepatosplenomegaly masses or tenderness.  Prostate is normal without nodules.  No lower extremity edema.  Neuro intact without focal deficits.  Affect is normal with normal thought process and judgment.   Assessment and Plan:   History of pneumonia 2019  History of BPH treated with medication  Sleep apnea treated with CPAP  History of recurrent sinusitis  Impaired glucose tolerance-stable  History of aspirin sensitivity status post desensitization procedure  History of depression stable on treatment  History of hyperlipidemia treated with statin  History of idiopathic peripheral neuropathy  Senile purpura  History of hemorrhoids treated with steroid  containing cream  History of Herpes zoster treated with Valtrex  GE reflux stable on PPI  History of peripheral neuropathy  Plan: Return in 6 months or as needed.  Continue follow-up with ENT and pulmonology at [facility] and with urology.          . Subjective:   Patient presents for Medicare Annual/Subsequent preventive examination.  Review Past Medical/Family/Social: See above   Risk  Factors  Current exercise habits: Walks Dietary issues discussed: Low-fat low carbohydrate  Cardiac risk factors: Hyperlipidemia and family history of stroke  Depression Screen  (Note: if answer to either of the following is "Yes", a more complete depression screening is indicated)   Over the past two weeks, have you felt down, depressed or hopeless? No  Over the past two weeks, have you felt little interest or pleasure in doing things? No Have you lost interest or pleasure in daily life? No Do you often feel hopeless? No Do you cry easily over simple problems? No   Activities of Daily Living  In your present state of health, do you have any difficulty performing the following activities?:   Driving? No  Managing money? No  Feeding yourself? No  Getting from bed to chair? No  Climbing a flight of stairs? No  Preparing food and eating?: No  Bathing or showering? No  Getting dressed: No  Getting to the toilet? No  Using the toilet:No  Moving around from place to place: No  In the past year have you fallen or had a near fall?:No  Are you sexually active? No  Do you have more than one partner? No   Hearing Difficulties: Yes Do you often ask people to speak up or repeat themselves?  Yes Do you experience ringing or noises in your ears?  Yes Do you have difficulty understanding soft or whispered voices?  Yes Do you feel that you have a problem with memory? No Do you often misplace items? No    Home Safety:  Do you have a smoke alarm at your residence? Yes Do you have grab bars in the bathroom?  Yes Do you have throw rugs in your house?  None   Cognitive Testing  Alert? Yes Normal Appearance?Yes  Oriented to person? Yes Place? Yes  Time? Yes  Recall of three objects? Yes  Can perform simple calculations? Yes  Displays appropriate judgment?Yes  Can read the correct time from a watch face?Yes   List the Names of Other Physician/Practitioners you currently use:  See  referral list for the physicians patient is currently seeing.     Review of Systems: See above   Objective:     General appearance: Appears stated age  Head: Normocephalic, without obvious abnormality, atraumatic  Eyes: conj clear, EOMi PEERLA  Ears: normal TM's and external ear canals both ears  Nose: Nares normal. Septum midline. Mucosa normal. No drainage or sinus tenderness.  Throat: lips, mucosa, and tongue normal; teeth and gums normal  Neck: no adenopathy, no carotid bruit, no JVD, supple, symmetrical, trachea midline and thyroid not enlarged, symmetric, no tenderness/mass/nodules  No CVA tenderness.  Lungs: clear to auscultation bilaterally  Breasts: normal appearance, no masses or tenderness Heart: regular rate and rhythm, S1, S2 normal, no murmur, click, rub or gallop  Abdomen: soft, non-tender; bowel sounds normal; no masses, no organomegaly  Musculoskeletal: ROM normal in all joints, no crepitus, no deformity, Normal muscle strengthen. Back  is symmetric, no curvature. Skin: Skin color, texture,  turgor normal. No rashes or lesions  Lymph nodes: Cervical, supraclavicular, and axillary nodes normal.  Neurologic: CN 2 -12 Normal, Normal symmetric reflexes. Normal coordination and gait  Psych: Alert & Oriented x 3, Mood appear stable.    Assessment:    Annual wellness medicare exam   Plan:    During the course of the visit the patient was educated and counseled about appropriate screening and preventive services including:   Annual flu vaccine     Patient Instructions (the written plan) was given to the patient.  Medicare Attestation  I have personally reviewed:  The patient's medical and social history  Their use of alcohol, tobacco or illicit drugs  Their current medications and supplements  The patient's functional ability including ADLs,fall risks, home safety risks, cognitive, and hearing and visual impairment  Diet and physical activities  Evidence for  depression or mood disorders  The patient's weight, height, BMI, and visual acuity have been recorded in the chart. I have made referrals, counseling, and provided education to the patient based on review of the above and I have provided the patient with a written personalized care plan for preventive services.

## 2018-08-24 DIAGNOSIS — Z87891 Personal history of nicotine dependence: Secondary | ICD-10-CM | POA: Diagnosis not present

## 2018-08-24 DIAGNOSIS — Z79899 Other long term (current) drug therapy: Secondary | ICD-10-CM | POA: Diagnosis not present

## 2018-08-24 DIAGNOSIS — Z9889 Other specified postprocedural states: Secondary | ICD-10-CM | POA: Diagnosis not present

## 2018-08-24 DIAGNOSIS — J4550 Severe persistent asthma, uncomplicated: Secondary | ICD-10-CM | POA: Diagnosis not present

## 2018-08-24 DIAGNOSIS — J324 Chronic pansinusitis: Secondary | ICD-10-CM | POA: Diagnosis not present

## 2018-08-24 DIAGNOSIS — D721 Eosinophilia: Secondary | ICD-10-CM | POA: Diagnosis not present

## 2018-08-25 LAB — POCT URINALYSIS DIPSTICK
Appearance: NEGATIVE
Bilirubin, UA: NEGATIVE
Blood, UA: NEGATIVE
Glucose, UA: NEGATIVE
Ketones, UA: NEGATIVE
Leukocytes, UA: NEGATIVE
Nitrite, UA: NEGATIVE
Odor: NEGATIVE
Protein, UA: NEGATIVE
Spec Grav, UA: 1.015 (ref 1.010–1.025)
Urobilinogen, UA: 0.2 E.U./dL
pH, UA: 6.5 (ref 5.0–8.0)

## 2018-09-04 ENCOUNTER — Telehealth: Payer: Self-pay | Admitting: Internal Medicine

## 2018-09-04 NOTE — Telephone Encounter (Signed)
It is likely a lipoma. See if it gets better and we can check later in the week.

## 2018-09-04 NOTE — Telephone Encounter (Signed)
Left detailed message.   

## 2018-09-04 NOTE — Patient Instructions (Signed)
Continue current medications and follow-up in 6 months.  It was a pleasure to see you today.  Have annual flu vaccine.

## 2018-09-04 NOTE — Telephone Encounter (Signed)
Jeffrey Wu [phone]  [name] called to say he notice a little lump on his abdomen, he has messed with it trying to see what it was and it is now a little sore, he would like to come in for you to check it out.Marland Kitchen

## 2018-09-14 ENCOUNTER — Other Ambulatory Visit: Payer: Self-pay

## 2018-09-14 ENCOUNTER — Ambulatory Visit (INDEPENDENT_AMBULATORY_CARE_PROVIDER_SITE_OTHER): Payer: Medicare PPO | Admitting: Internal Medicine

## 2018-09-14 ENCOUNTER — Encounter: Payer: Self-pay | Admitting: Internal Medicine

## 2018-09-14 VITALS — BP 120/70 | HR 60 | Temp 98.1°F | Ht 70.0 in | Wt 182.0 lb

## 2018-09-14 DIAGNOSIS — Z23 Encounter for immunization: Secondary | ICD-10-CM | POA: Diagnosis not present

## 2018-09-14 NOTE — Patient Instructions (Signed)
Patient received a flu vaccine IM L deltoid, AV, CMA  

## 2018-09-14 NOTE — Progress Notes (Signed)
Flu vaccine given by CMA 

## 2018-10-25 DIAGNOSIS — M542 Cervicalgia: Secondary | ICD-10-CM | POA: Diagnosis not present

## 2018-10-25 DIAGNOSIS — H6981 Other specified disorders of Eustachian tube, right ear: Secondary | ICD-10-CM | POA: Diagnosis not present

## 2018-10-25 DIAGNOSIS — M62838 Other muscle spasm: Secondary | ICD-10-CM | POA: Diagnosis not present

## 2018-11-07 ENCOUNTER — Ambulatory Visit (INDEPENDENT_AMBULATORY_CARE_PROVIDER_SITE_OTHER): Payer: Medicare PPO | Admitting: Internal Medicine

## 2018-11-07 ENCOUNTER — Other Ambulatory Visit: Payer: Self-pay | Admitting: Internal Medicine

## 2018-11-07 ENCOUNTER — Ambulatory Visit
Admission: RE | Admit: 2018-11-07 | Discharge: 2018-11-07 | Disposition: A | Discharge status: Home / Self Care | Payer: Medicare PPO | Source: Ambulatory Visit | Attending: Internal Medicine | Admitting: Internal Medicine

## 2018-11-07 ENCOUNTER — Encounter: Payer: Self-pay | Admitting: Internal Medicine

## 2018-11-07 ENCOUNTER — Other Ambulatory Visit: Payer: Self-pay

## 2018-11-07 ENCOUNTER — Telehealth: Payer: Self-pay

## 2018-11-07 VITALS — BP 140/80 | HR 67 | Temp 97.8°F | Ht 70.0 in | Wt 181.0 lb

## 2018-11-07 DIAGNOSIS — W19XXXA Unspecified fall, initial encounter: Secondary | ICD-10-CM

## 2018-11-07 DIAGNOSIS — R0781 Pleurodynia: Secondary | ICD-10-CM

## 2018-11-07 DIAGNOSIS — R0789 Other chest pain: Secondary | ICD-10-CM

## 2018-11-07 DIAGNOSIS — S299XXA Unspecified injury of thorax, initial encounter: Secondary | ICD-10-CM | POA: Diagnosis not present

## 2018-11-07 MED ORDER — HYDROCODONE-ACETAMINOPHEN 10-325 MG PO TABS: 1.0000 | ORAL_TABLET | Freq: Three times a day (TID) | ORAL | 0 refills | Status: AC | PRN

## 2018-11-07 NOTE — Progress Notes (Signed)
   Subjective:    Patient ID: Jeffrey Wu., male    DOB: 10-Nov-1945, 73 y.o.   MRN: [id number]  HPI Patient was walking [place] a green [place] yesterday. He stepped on a gumball and lost his balance resulting in a fall striking his right rib cage area when he fell to the ground. Did not strike head and no LOC. Having pain right lateral rib cage area. No SOB.    Review of Systems see above- no SOB     Objective:   Physical Exam Vs reviewed and are stable  Point tenderness right lower rib cage area anterolateral. No obvious bruising. Chest clear to auscultation. VS reviewed and are stable.      Assessment & Plan:  Possible right rib fracture  Plan trauma to rib cage secondary to a fall  Plan: Right rib detail films  Rib films negative for fracture. Declined Pain med Rx. RTC prn

## 2018-11-07 NOTE — Telephone Encounter (Signed)
Patient called he fell yesterday and hit his ribs on the right side and now he is hurting, he said it hurts to get in and out of a chair. He wants to know what you suggest he do?

## 2018-11-07 NOTE — Telephone Encounter (Signed)
Schedule appointment?

## 2018-11-07 NOTE — Telephone Encounter (Signed)
Office visit. May need Xray

## 2018-11-19 NOTE — Patient Instructions (Signed)
Rib Xrays negative for fracture RTC prn

## 2018-12-21 DIAGNOSIS — G609 Hereditary and idiopathic neuropathy, unspecified: Secondary | ICD-10-CM | POA: Diagnosis not present

## 2018-12-21 DIAGNOSIS — M7741 Metatarsalgia, right foot: Secondary | ICD-10-CM | POA: Diagnosis not present

## 2018-12-21 DIAGNOSIS — M79671 Pain in right foot: Secondary | ICD-10-CM | POA: Diagnosis not present

## 2018-12-21 DIAGNOSIS — M7742 Metatarsalgia, left foot: Secondary | ICD-10-CM | POA: Diagnosis not present

## 2018-12-21 DIAGNOSIS — M79672 Pain in left foot: Secondary | ICD-10-CM | POA: Diagnosis not present

## 2019-01-09 DIAGNOSIS — M7741 Metatarsalgia, right foot: Secondary | ICD-10-CM | POA: Diagnosis not present

## 2019-01-09 DIAGNOSIS — M7742 Metatarsalgia, left foot: Secondary | ICD-10-CM | POA: Diagnosis not present

## 2019-01-16 DIAGNOSIS — M7741 Metatarsalgia, right foot: Secondary | ICD-10-CM | POA: Diagnosis not present

## 2019-01-16 DIAGNOSIS — M7742 Metatarsalgia, left foot: Secondary | ICD-10-CM | POA: Diagnosis not present

## 2019-01-25 DIAGNOSIS — M7741 Metatarsalgia, right foot: Secondary | ICD-10-CM | POA: Diagnosis not present

## 2019-01-25 DIAGNOSIS — M7742 Metatarsalgia, left foot: Secondary | ICD-10-CM | POA: Diagnosis not present

## 2019-01-29 DIAGNOSIS — H6983 Other specified disorders of Eustachian tube, bilateral: Secondary | ICD-10-CM | POA: Diagnosis not present

## 2019-01-29 DIAGNOSIS — Z87891 Personal history of nicotine dependence: Secondary | ICD-10-CM | POA: Diagnosis not present

## 2019-01-29 DIAGNOSIS — J324 Chronic pansinusitis: Secondary | ICD-10-CM | POA: Diagnosis not present

## 2019-01-29 DIAGNOSIS — J988 Other specified respiratory disorders: Secondary | ICD-10-CM | POA: Diagnosis not present

## 2019-01-29 DIAGNOSIS — H6522 Chronic serous otitis media, left ear: Secondary | ICD-10-CM | POA: Diagnosis not present

## 2019-01-29 DIAGNOSIS — J984 Other disorders of lung: Secondary | ICD-10-CM | POA: Diagnosis not present

## 2019-01-29 DIAGNOSIS — T39015A Adverse effect of aspirin, initial encounter: Secondary | ICD-10-CM | POA: Diagnosis not present

## 2019-01-29 DIAGNOSIS — J45998 Other asthma: Secondary | ICD-10-CM | POA: Diagnosis not present

## 2019-02-12 ENCOUNTER — Telehealth: Payer: Self-pay | Admitting: Internal Medicine

## 2019-02-12 MED ORDER — ROSUVASTATIN CALCIUM 10 MG PO TABS: 10.0000 mg | ORAL_TABLET | Freq: Every day | ORAL | 3 refills | Status: DC

## 2019-02-12 NOTE — Telephone Encounter (Signed)
Received Fax RX request from  Pharmacy -  [id number] - [place], [address] Phone:  [phone]  Fax:  [phone]       Medication - rosuvastatin (CRESTOR) 10 MG tablet   Last Refill - 11/07/18  Last OV - 11/07/18  Last CPE - 08/11/18  Next Appointment -

## 2019-02-13 ENCOUNTER — Other Ambulatory Visit: Payer: Self-pay | Admitting: Internal Medicine

## 2019-02-23 DIAGNOSIS — Z9889 Other specified postprocedural states: Secondary | ICD-10-CM | POA: Diagnosis not present

## 2019-02-23 DIAGNOSIS — J4550 Severe persistent asthma, uncomplicated: Secondary | ICD-10-CM | POA: Diagnosis not present

## 2019-02-23 DIAGNOSIS — Z7951 Long term (current) use of inhaled steroids: Secondary | ICD-10-CM | POA: Diagnosis not present

## 2019-02-23 DIAGNOSIS — D7219 Other eosinophilia: Secondary | ICD-10-CM | POA: Diagnosis not present

## 2019-02-23 DIAGNOSIS — J339 Nasal polyp, unspecified: Secondary | ICD-10-CM | POA: Diagnosis not present

## 2019-02-23 DIAGNOSIS — Z87891 Personal history of nicotine dependence: Secondary | ICD-10-CM | POA: Diagnosis not present

## 2019-02-23 DIAGNOSIS — J324 Chronic pansinusitis: Secondary | ICD-10-CM | POA: Diagnosis not present

## 2019-02-27 DIAGNOSIS — H25013 Cortical age-related cataract, bilateral: Secondary | ICD-10-CM | POA: Diagnosis not present

## 2019-02-27 DIAGNOSIS — H43812 Vitreous degeneration, left eye: Secondary | ICD-10-CM | POA: Diagnosis not present

## 2019-02-27 DIAGNOSIS — H2513 Age-related nuclear cataract, bilateral: Secondary | ICD-10-CM | POA: Diagnosis not present

## 2019-02-27 DIAGNOSIS — H43393 Other vitreous opacities, bilateral: Secondary | ICD-10-CM | POA: Diagnosis not present

## 2019-03-21 ENCOUNTER — Other Ambulatory Visit: Payer: Self-pay | Admitting: Internal Medicine

## 2019-03-22 IMAGING — CR DG CHEST 2V
2 series · 2 of 2 positions shown · non-contrast
Comparison: 11/01/2016.

CLINICAL DATA: Cough and congestion.  Fever.

EXAM:
CHEST - 2 VIEW

[w chest pa]
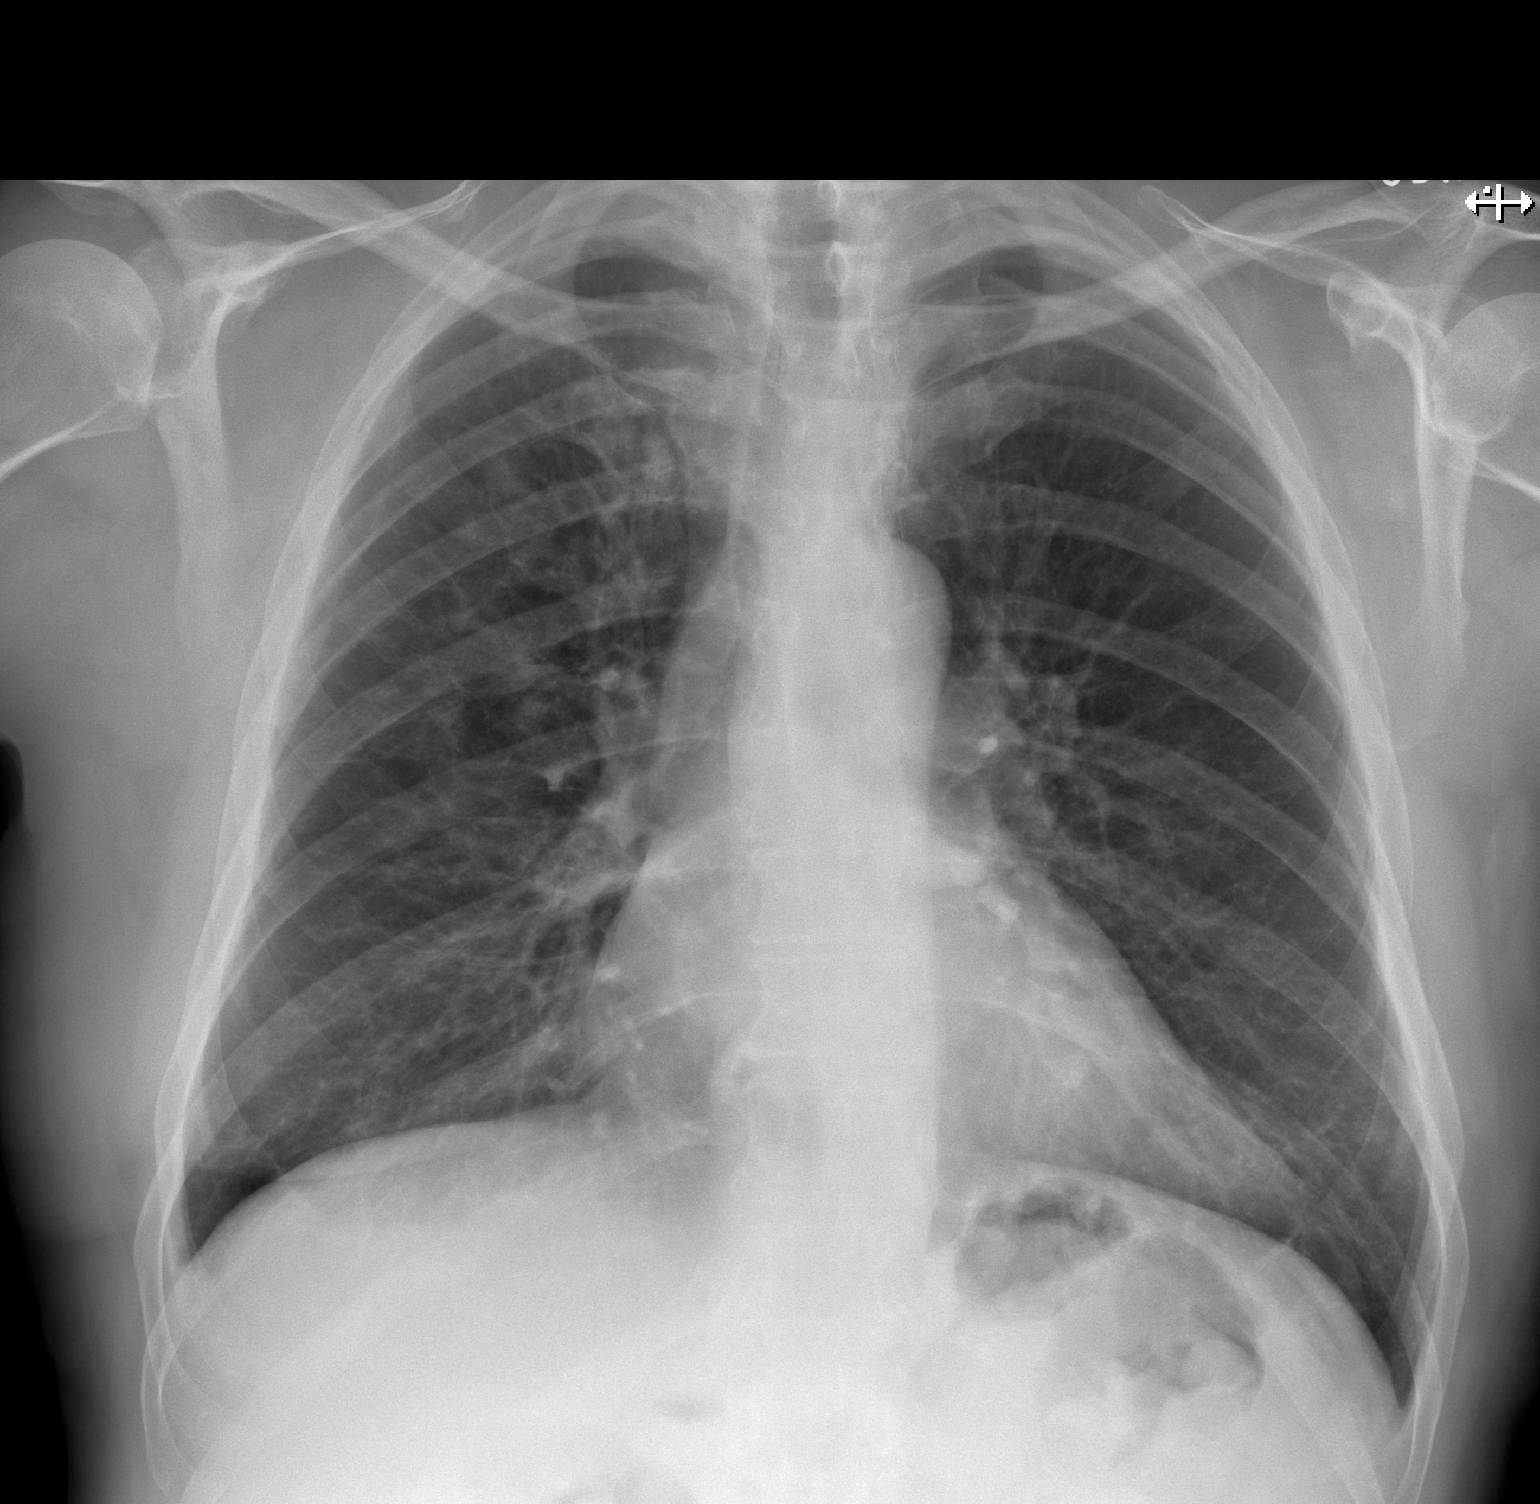

[w chest lat]
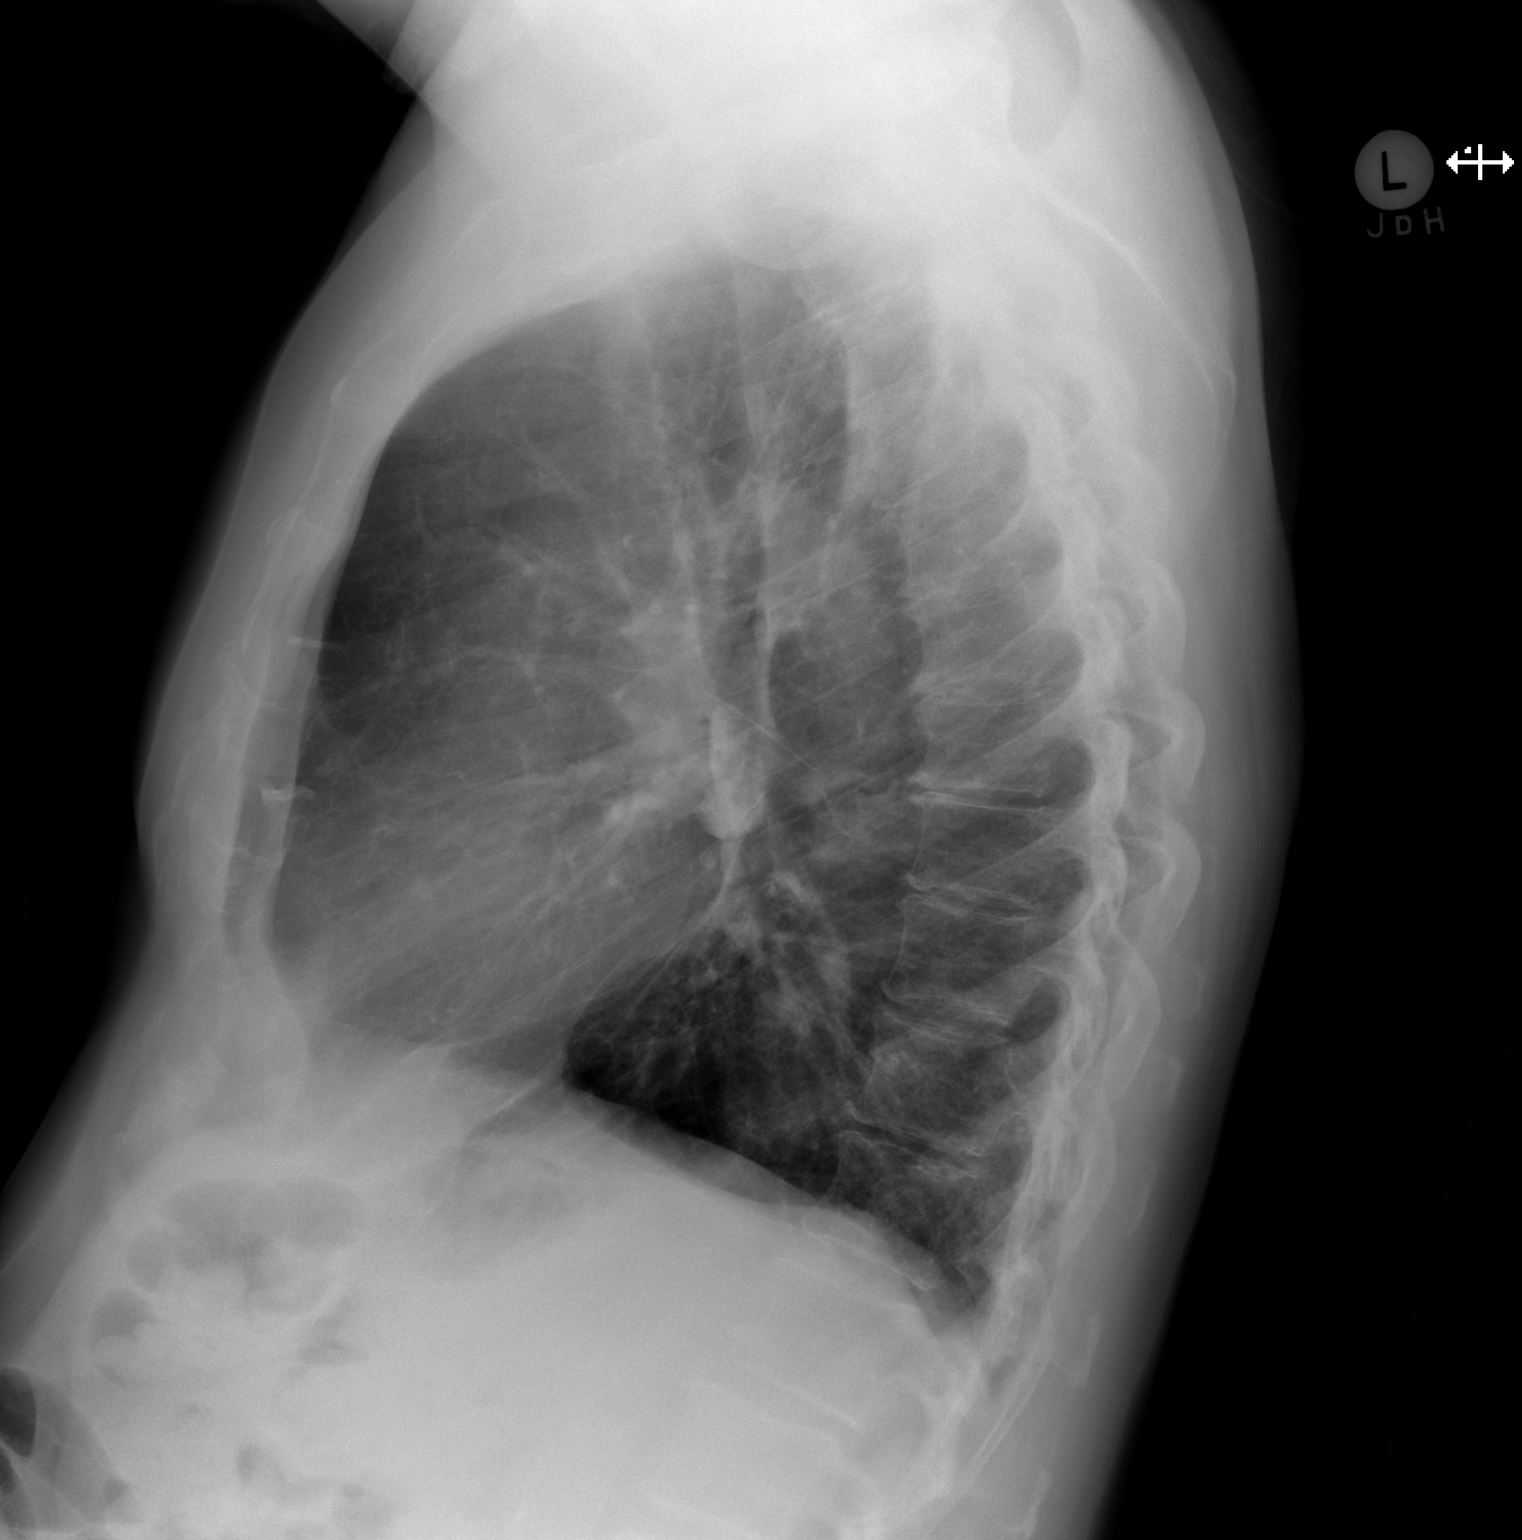

[2 of 2 positions shown; findings below may reference images not displayed]

FINDINGS: Mediastinum and hilar structures normal. Mild nodular infiltrate
right upper lobe and lower lobe. Follow-up chest x-rays to
demonstrate resolution suggested. No pleural effusion or
pneumothorax. Heart size normal. Degenerative changes thoracic
spine.
IMPRESSION: Mild nodular infiltrates right upper lobe and left lower lobe.
Followup PA and lateral chest X-ray is recommended in 3-4 weeks
following trial of antibiotic therapy to ensure resolution and
exclude underlying malignancy.

## 2019-03-27 DIAGNOSIS — D485 Neoplasm of uncertain behavior of skin: Secondary | ICD-10-CM | POA: Diagnosis not present

## 2019-03-27 DIAGNOSIS — Z85828 Personal history of other malignant neoplasm of skin: Secondary | ICD-10-CM | POA: Diagnosis not present

## 2019-03-27 DIAGNOSIS — L905 Scar conditions and fibrosis of skin: Secondary | ICD-10-CM | POA: Diagnosis not present

## 2019-03-27 DIAGNOSIS — C44329 Squamous cell carcinoma of skin of other parts of face: Secondary | ICD-10-CM | POA: Diagnosis not present

## 2019-03-27 DIAGNOSIS — B078 Other viral warts: Secondary | ICD-10-CM | POA: Diagnosis not present

## 2019-04-05 DIAGNOSIS — H938X2 Other specified disorders of left ear: Secondary | ICD-10-CM | POA: Diagnosis not present

## 2019-04-05 DIAGNOSIS — H6592 Unspecified nonsuppurative otitis media, left ear: Secondary | ICD-10-CM | POA: Diagnosis not present

## 2019-04-05 DIAGNOSIS — H90A21 Sensorineural hearing loss, unilateral, right ear, with restricted hearing on the contralateral side: Secondary | ICD-10-CM | POA: Diagnosis not present

## 2019-04-05 DIAGNOSIS — H73892 Other specified disorders of tympanic membrane, left ear: Secondary | ICD-10-CM | POA: Diagnosis not present

## 2019-04-05 DIAGNOSIS — H90A32 Mixed conductive and sensorineural hearing loss, unilateral, left ear with restricted hearing on the contralateral side: Secondary | ICD-10-CM | POA: Diagnosis not present

## 2019-05-07 DIAGNOSIS — L821 Other seborrheic keratosis: Secondary | ICD-10-CM | POA: Diagnosis not present

## 2019-05-07 DIAGNOSIS — Z85828 Personal history of other malignant neoplasm of skin: Secondary | ICD-10-CM | POA: Diagnosis not present

## 2019-05-07 DIAGNOSIS — B078 Other viral warts: Secondary | ICD-10-CM | POA: Diagnosis not present

## 2019-05-18 DIAGNOSIS — H2511 Age-related nuclear cataract, right eye: Secondary | ICD-10-CM | POA: Diagnosis not present

## 2019-05-18 DIAGNOSIS — H2513 Age-related nuclear cataract, bilateral: Secondary | ICD-10-CM | POA: Diagnosis not present

## 2019-05-18 DIAGNOSIS — H2512 Age-related nuclear cataract, left eye: Secondary | ICD-10-CM | POA: Diagnosis not present

## 2019-05-18 DIAGNOSIS — H18513 Endothelial corneal dystrophy, bilateral: Secondary | ICD-10-CM | POA: Diagnosis not present

## 2019-05-18 DIAGNOSIS — H16223 Keratoconjunctivitis sicca, not specified as Sjogren's, bilateral: Secondary | ICD-10-CM | POA: Diagnosis not present

## 2019-06-01 DIAGNOSIS — H01021 Squamous blepharitis right upper eyelid: Secondary | ICD-10-CM | POA: Diagnosis not present

## 2019-06-01 DIAGNOSIS — H04123 Dry eye syndrome of bilateral lacrimal glands: Secondary | ICD-10-CM | POA: Diagnosis not present

## 2019-06-19 DIAGNOSIS — B078 Other viral warts: Secondary | ICD-10-CM | POA: Diagnosis not present

## 2019-07-11 DIAGNOSIS — Z888 Allergy status to other drugs, medicaments and biological substances status: Secondary | ICD-10-CM | POA: Diagnosis not present

## 2019-07-11 DIAGNOSIS — J45909 Unspecified asthma, uncomplicated: Secondary | ICD-10-CM | POA: Diagnosis not present

## 2019-07-11 DIAGNOSIS — K219 Gastro-esophageal reflux disease without esophagitis: Secondary | ICD-10-CM | POA: Diagnosis not present

## 2019-07-11 DIAGNOSIS — Z7982 Long term (current) use of aspirin: Secondary | ICD-10-CM | POA: Diagnosis not present

## 2019-07-11 DIAGNOSIS — E785 Hyperlipidemia, unspecified: Secondary | ICD-10-CM | POA: Diagnosis not present

## 2019-07-11 DIAGNOSIS — H2512 Age-related nuclear cataract, left eye: Secondary | ICD-10-CM | POA: Diagnosis not present

## 2019-07-11 DIAGNOSIS — G4730 Sleep apnea, unspecified: Secondary | ICD-10-CM | POA: Diagnosis not present

## 2019-07-11 DIAGNOSIS — Z79899 Other long term (current) drug therapy: Secondary | ICD-10-CM | POA: Diagnosis not present

## 2019-07-11 DIAGNOSIS — H269 Unspecified cataract: Secondary | ICD-10-CM | POA: Diagnosis not present

## 2019-07-12 DIAGNOSIS — H2511 Age-related nuclear cataract, right eye: Secondary | ICD-10-CM | POA: Diagnosis not present

## 2019-07-23 ENCOUNTER — Other Ambulatory Visit: Payer: Self-pay | Admitting: Internal Medicine

## 2019-07-23 DIAGNOSIS — J1289 Other viral pneumonia: Secondary | ICD-10-CM

## 2019-07-30 DIAGNOSIS — T39015A Adverse effect of aspirin, initial encounter: Secondary | ICD-10-CM | POA: Diagnosis not present

## 2019-07-30 DIAGNOSIS — J324 Chronic pansinusitis: Secondary | ICD-10-CM | POA: Diagnosis not present

## 2019-07-30 DIAGNOSIS — J339 Nasal polyp, unspecified: Secondary | ICD-10-CM | POA: Diagnosis not present

## 2019-07-30 DIAGNOSIS — J45998 Other asthma: Secondary | ICD-10-CM | POA: Diagnosis not present

## 2019-07-30 DIAGNOSIS — J45909 Unspecified asthma, uncomplicated: Secondary | ICD-10-CM | POA: Diagnosis not present

## 2019-07-30 DIAGNOSIS — Z87891 Personal history of nicotine dependence: Secondary | ICD-10-CM | POA: Diagnosis not present

## 2019-08-01 DIAGNOSIS — J45909 Unspecified asthma, uncomplicated: Secondary | ICD-10-CM | POA: Diagnosis not present

## 2019-08-01 DIAGNOSIS — G4730 Sleep apnea, unspecified: Secondary | ICD-10-CM | POA: Diagnosis not present

## 2019-08-01 DIAGNOSIS — K219 Gastro-esophageal reflux disease without esophagitis: Secondary | ICD-10-CM | POA: Diagnosis not present

## 2019-08-01 DIAGNOSIS — H269 Unspecified cataract: Secondary | ICD-10-CM | POA: Diagnosis not present

## 2019-08-01 DIAGNOSIS — I10 Essential (primary) hypertension: Secondary | ICD-10-CM | POA: Diagnosis not present

## 2019-08-01 DIAGNOSIS — H2511 Age-related nuclear cataract, right eye: Secondary | ICD-10-CM | POA: Diagnosis not present

## 2019-08-05 IMAGING — CR DG CHEST 2V
2 series · 2 of 2 positions shown · non-contrast
Comparison: Chest x-ray of April 18, 2017

CLINICAL DATA: Recently diagnosed with pneumonia while in [place].
Follow-up study.

EXAM:
CHEST - 2 VIEW

[w chest pa]
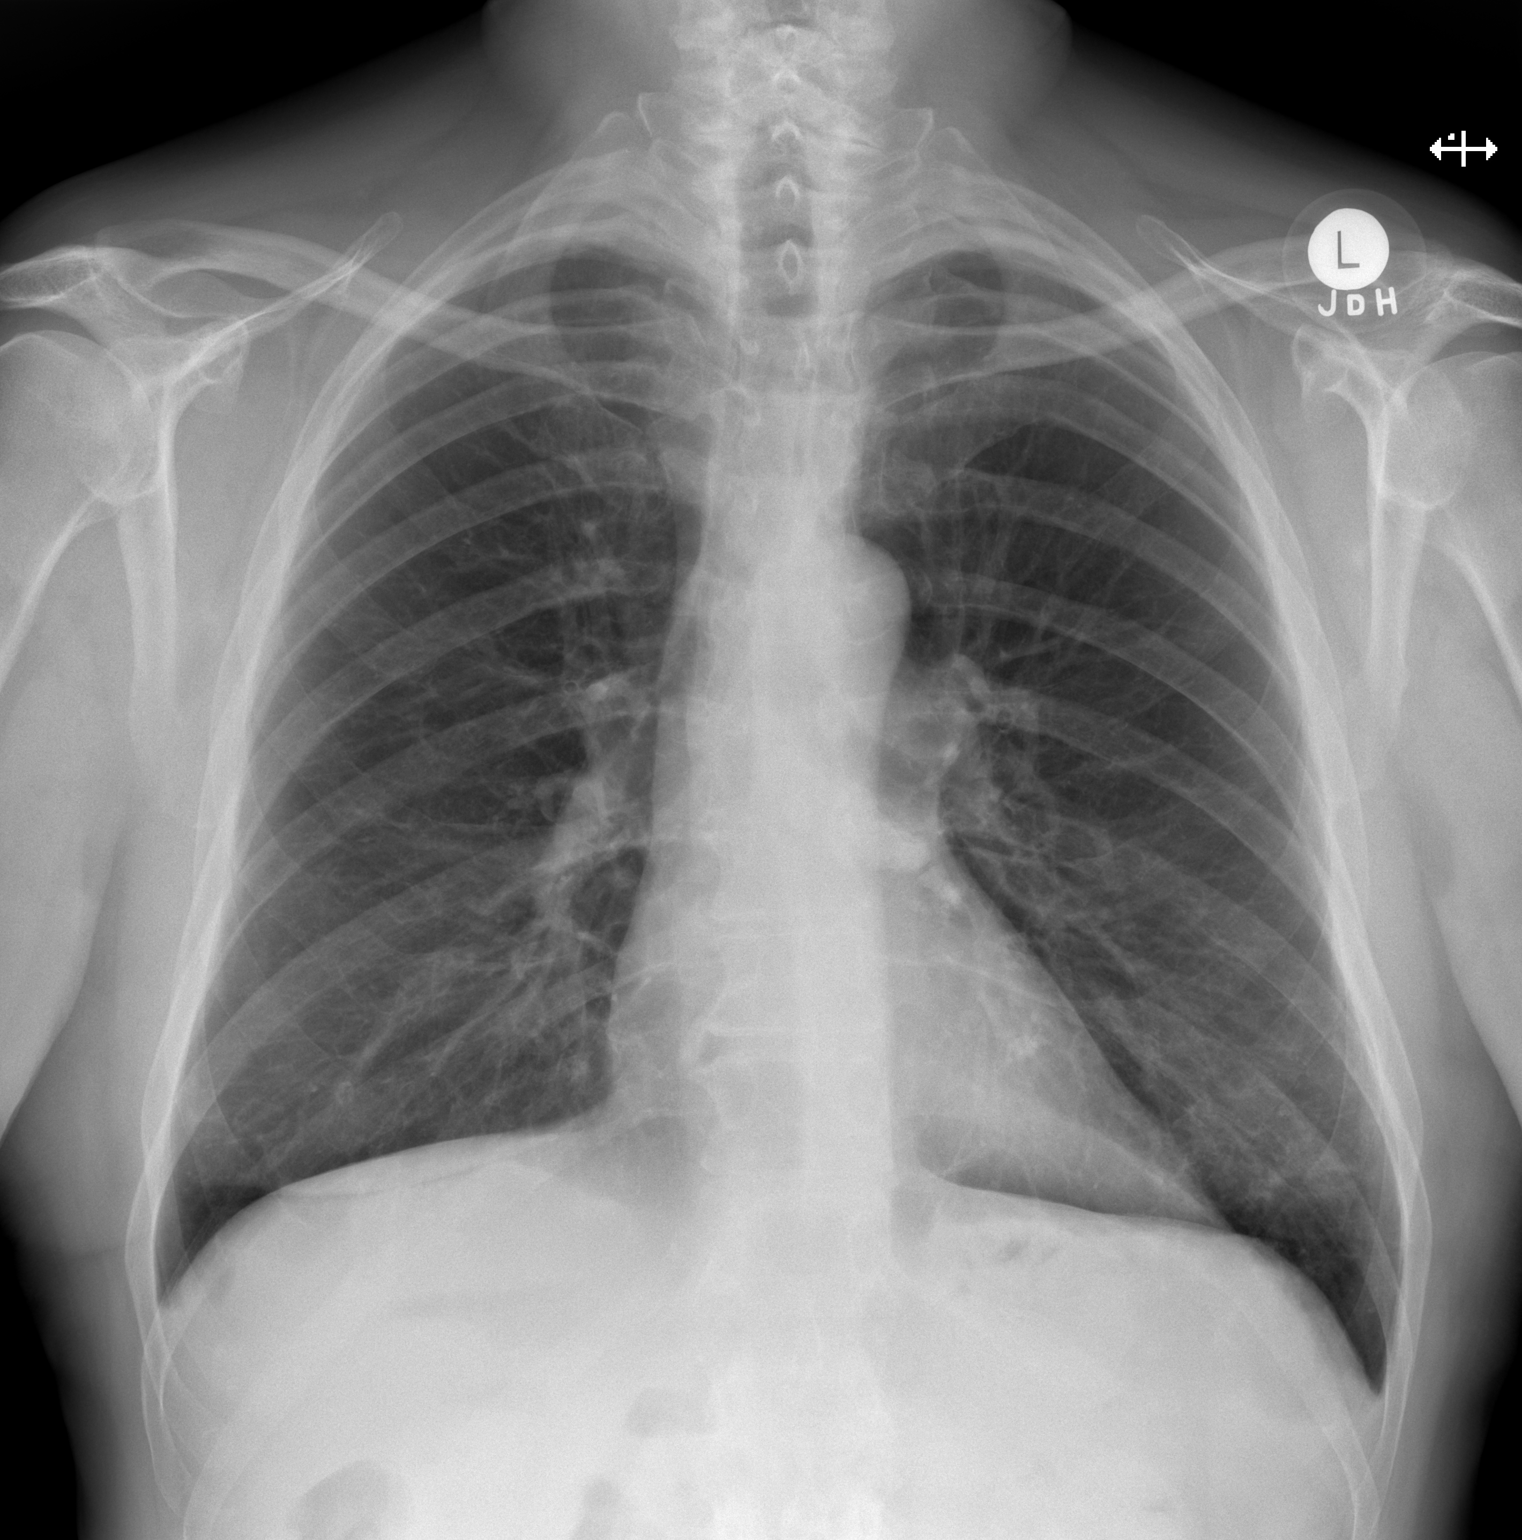

[w chest lat]
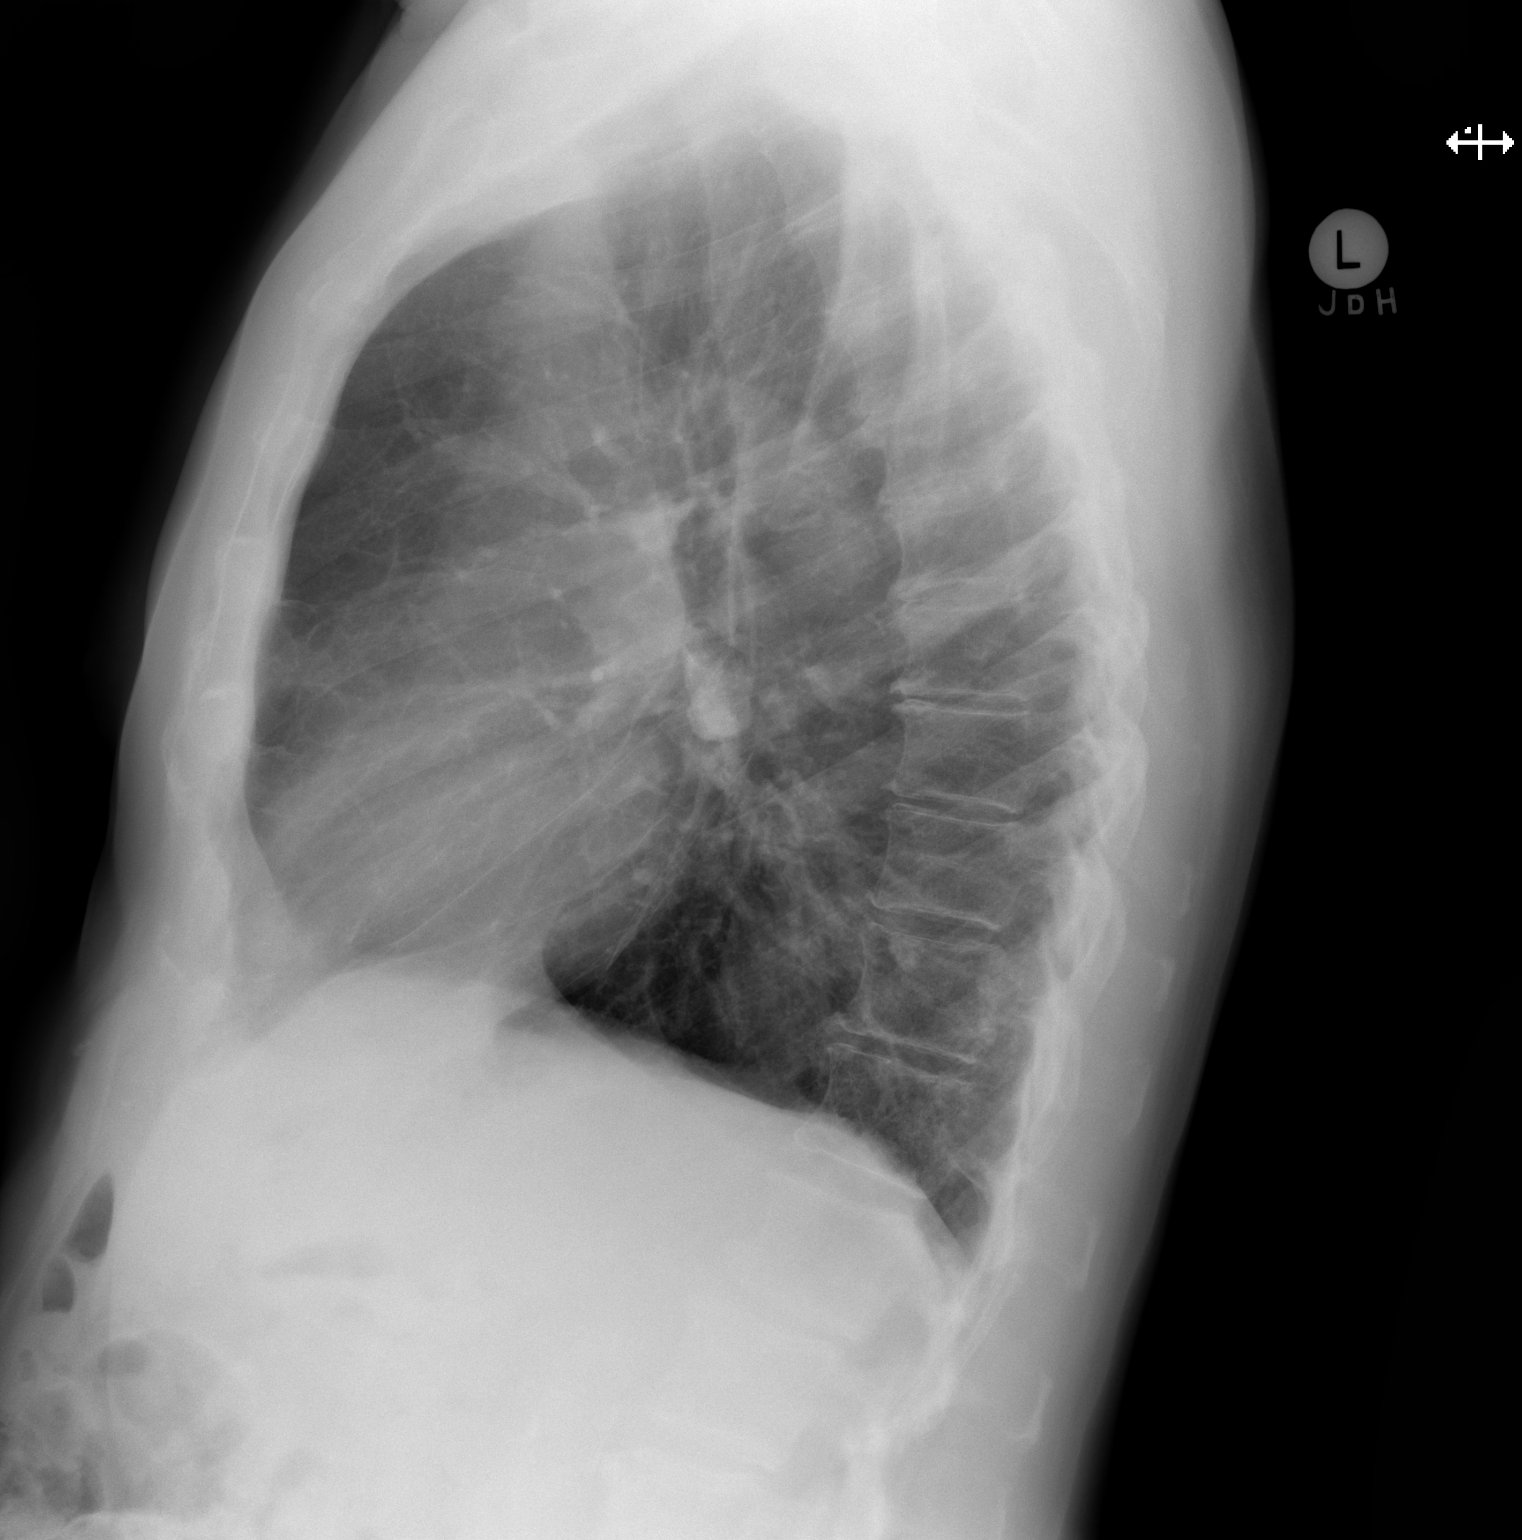

[2 of 2 positions shown; findings below may reference images not displayed]

FINDINGS: The lungs are well-expanded. There is no focal infiltrate. There is
no pleural effusion. There is stable biapical pleural thickening.
There is calcification in the wall of the aortic arch. The bony
thorax exhibits no acute abnormality.
IMPRESSION: Mild hyperinflation may be voluntary or may reflect the patient's
smoking history and chronic bronchitis. No pneumonia nor other acute
cardiopulmonary abnormality.

Thoracic aortic atherosclerosis.

## 2019-08-07 DIAGNOSIS — B078 Other viral warts: Secondary | ICD-10-CM | POA: Diagnosis not present

## 2019-08-10 ENCOUNTER — Other Ambulatory Visit: Payer: Self-pay

## 2019-08-10 ENCOUNTER — Other Ambulatory Visit: Payer: Medicare PPO | Admitting: Internal Medicine

## 2019-08-10 DIAGNOSIS — G4733 Obstructive sleep apnea (adult) (pediatric): Secondary | ICD-10-CM

## 2019-08-10 DIAGNOSIS — R7302 Impaired glucose tolerance (oral): Secondary | ICD-10-CM | POA: Diagnosis not present

## 2019-08-10 DIAGNOSIS — F39 Unspecified mood [affective] disorder: Secondary | ICD-10-CM | POA: Diagnosis not present

## 2019-08-10 DIAGNOSIS — G4730 Sleep apnea, unspecified: Secondary | ICD-10-CM | POA: Diagnosis not present

## 2019-08-10 DIAGNOSIS — G609 Hereditary and idiopathic neuropathy, unspecified: Secondary | ICD-10-CM

## 2019-08-10 DIAGNOSIS — E7800 Pure hypercholesterolemia, unspecified: Secondary | ICD-10-CM | POA: Diagnosis not present

## 2019-08-10 DIAGNOSIS — N400 Enlarged prostate without lower urinary tract symptoms: Secondary | ICD-10-CM

## 2019-08-10 DIAGNOSIS — Z0000 Encounter for general adult medical examination without abnormal findings: Secondary | ICD-10-CM | POA: Diagnosis not present

## 2019-08-11 LAB — COMPLETE METABOLIC PANEL WITH GFR
AG Ratio: 1.5 (calc) (ref 1.0–2.5)
ALT: 17 U/L (ref 9–46)
AST: 21 U/L (ref 10–35)
Albumin: 3.8 g/dL (ref 3.6–5.1)
Alkaline phosphatase (APISO): 87 U/L (ref 35–144)
BUN: 15 mg/dL (ref 7–25)
CO2: 28 mmol/L (ref 20–32)
Calcium: 9 mg/dL (ref 8.6–10.3)
Chloride: 103 mmol/L (ref 98–110)
Creat: 0.86 mg/dL (ref 0.70–1.18)
GFR, Est African American: 98 mL/min/{1.73_m2} (ref >=60)
GFR, Est Non African American: 85 mL/min/{1.73_m2} (ref >=60)
Globulin: 2.5 g/dL (calc) (ref 1.9–3.7)
Glucose, Bld: 97 mg/dL (ref 65–99)
Potassium: 4.3 mmol/L (ref 3.5–5.3)
Sodium: 140 mmol/L (ref 135–146)
Total Bilirubin: 0.6 mg/dL (ref 0.2–1.2)
Total Protein: 6.3 g/dL (ref 6.1–8.1)

## 2019-08-11 LAB — CBC WITH DIFFERENTIAL/PLATELET
Absolute Monocytes: 548 cells/uL (ref 200–950)
Basophils Absolute: 53 cells/uL (ref 0–200)
Basophils Relative: 0.8 %
Eosinophils Absolute: 1003 cells/uL — ABNORMAL HIGH (ref 15–500)
Eosinophils Relative: 15.2 %
HCT: 43.1 % (ref 38.5–50.0)
Hemoglobin: 14.6 g/dL (ref 13.2–17.1)
Lymphs Abs: 1881 cells/uL (ref 850–3900)
MCH: 32.2 pg (ref 27.0–33.0)
MCHC: 33.9 g/dL (ref 32.0–36.0)
MCV: 95.1 fL (ref 80.0–100.0)
MPV: 11.2 fL (ref 7.5–12.5)
Monocytes Relative: 8.3 %
Neutro Abs: 3115 cells/uL (ref 1500–7800)
Neutrophils Relative %: 47.2 %
Platelets: 166 10*3/uL (ref 140–400)
RBC: 4.53 10*6/uL (ref 4.20–5.80)
RDW: 12 % (ref 11.0–15.0)
Total Lymphocyte: 28.5 %
WBC: 6.6 10*3/uL (ref 3.8–10.8)

## 2019-08-11 LAB — LIPID PANEL
Cholesterol: 185 mg/dL (ref <200)
HDL: 69 mg/dL (ref >=40)
LDL Cholesterol (Calc): 99 mg/dL (calc)
Non-HDL Cholesterol (Calc): 116 mg/dL (calc) (ref <130)
Total CHOL/HDL Ratio: 2.7 (calc) (ref <5.0)
Triglycerides: 84 mg/dL (ref <150)

## 2019-08-11 LAB — PSA: PSA: 0.3 ng/mL (ref <=4.0)

## 2019-08-11 LAB — MICROALBUMIN / CREATININE URINE RATIO
Creatinine, Urine: 98 mg/dL (ref 20–320)
Microalb, Ur: <0.2 mg/dL

## 2019-08-11 LAB — HEMOGLOBIN A1C
Hgb A1c MFr Bld: 5.8 % of total Hgb — ABNORMAL HIGH (ref <5.7)
Mean Plasma Glucose: 120 (calc)
eAG (mmol/L): 6.6 (calc)

## 2019-08-14 ENCOUNTER — Ambulatory Visit: Payer: Medicare PPO | Admitting: Internal Medicine

## 2019-08-14 ENCOUNTER — Other Ambulatory Visit: Payer: Self-pay

## 2019-08-14 ENCOUNTER — Encounter: Payer: Self-pay | Admitting: Internal Medicine

## 2019-08-14 VITALS — BP 120/80 | HR 60 | Ht 71.0 in | Wt 185.0 lb

## 2019-08-14 DIAGNOSIS — N401 Enlarged prostate with lower urinary tract symptoms: Secondary | ICD-10-CM

## 2019-08-14 DIAGNOSIS — E7800 Pure hypercholesterolemia, unspecified: Secondary | ICD-10-CM

## 2019-08-14 DIAGNOSIS — Z9841 Cataract extraction status, right eye: Secondary | ICD-10-CM

## 2019-08-14 DIAGNOSIS — Z0000 Encounter for general adult medical examination without abnormal findings: Secondary | ICD-10-CM | POA: Diagnosis not present

## 2019-08-14 DIAGNOSIS — D692 Other nonthrombocytopenic purpura: Secondary | ICD-10-CM

## 2019-08-14 DIAGNOSIS — G4733 Obstructive sleep apnea (adult) (pediatric): Secondary | ICD-10-CM

## 2019-08-14 DIAGNOSIS — G609 Hereditary and idiopathic neuropathy, unspecified: Secondary | ICD-10-CM

## 2019-08-14 DIAGNOSIS — Z8659 Personal history of other mental and behavioral disorders: Secondary | ICD-10-CM

## 2019-08-14 DIAGNOSIS — R7302 Impaired glucose tolerance (oral): Secondary | ICD-10-CM

## 2019-08-14 LAB — POCT URINALYSIS DIPSTICK
Appearance: NEGATIVE
Bilirubin, UA: NEGATIVE
Blood, UA: NEGATIVE
Glucose, UA: NEGATIVE
Ketones, UA: NEGATIVE
Leukocytes, UA: NEGATIVE
Nitrite, UA: NEGATIVE
Odor: NEGATIVE
Protein, UA: NEGATIVE
Spec Grav, UA: 1.02 (ref 1.010–1.025)
Urobilinogen, UA: 0.2 E.U./dL
pH, UA: 6 (ref 5.0–8.0)

## 2019-08-14 NOTE — Progress Notes (Signed)
Subjective:    Patient ID: Jeffrey Wu., male    DOB: Feb 05, 1945, 74 y.o.   MRN: [id number]  HPI 74 year old Male For Medicare Wellness, health maintenance exam and evaluation of medical issues.  He feels well with no new complaints.  He recently was seen at [facility] for follow-up on chronic sinusitis and allergic rhinitis.  He is on [place] which works well for him.  He used to be on chronic steroid therapy.  He recently had bilateral cataract extractions by Dr. Anson Oregon in [place] in July and did well.  History of BPH, hyperlipidemia and history of depression.  History of sleep apnea.  Has CPAP device but  does not use it regularly.  History of pneumonia while traveling to [place] in 2019.  Past medical history: Appendectomy 1955, thyroglossal duct cyst removed in 1990.  Hernia repair in 2009.  Social history: He is married.  Wife is a retired Land.  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  He quit smoking several years ago but previously smoked a pack of cigarettes daily for some 46 years.  Consumes alcohol daily.  Family history: Father died at age 63 of leukemia when patient was 80 years old.  Mother died at age 35 of a stroke after heart surgery.  1 brother and 1 sister in good health.  3 adult children, 2 sons and a daughter all of whom are in good health.  In 2015 he had endoscopic sinus surgery at [facility] by Dr. Vicente Masson.  He had improvement in his sense of smell and decreased nasal discharge.  He was then on a prolonged steroid taper.  In 2016 he had complete loss of smell and increased nasal congestion despite aspirin therapy, nasal steroid and leukotriene inhibitor.  Endoscopy of the sinuses revealed ethmoid polypoid edema and eosinophilic-like secretions that were tenacious.  He underwent a repeat oral steroid burst and began prednisone 5 mg daily.  In the Fall 2019 he had bilateral  ethmoidectomies, frontal sinusotomies and sphenoidotomies were performed.  He is followed by pulmonary at [facility].  He is on Dupixent every 2 weeks.  Has sensorineural hearing loss of right ear and restricted hearing on the left.  He had onset of asthma at age 57.  History of impaired glucose tolerance with hemoglobin A1c 5.8%.  Lipid panel is normal.  CBC is normal except for elevated eosinophils at 1003.  PSA is normal.  For depression he takes Wellbutrin.  For GE reflux he takes Prilosec.  For asthma/allergic rhinitis he is maintained on Dulera, Allegra and Singulair.  He takes Crestor 10 mg daily for hyperlipidemia.    Review of Systems history of prostatism treated with Flomax     Objective:   Physical Exam Blood pressure 120/80, pulse 60, pulse oximetry 95% weight 185 pounds height 5 feet 11 inches BMI 25.80  Skin warm and dry.  Nodes none.  TMs are clear.  Neck is supple without JVD thyromegaly or carotid bruits.  Chest clear to auscultation.  Cardiac exam regular rate and rhythm normal S1 and S2 without murmurs or gallops.  Abdomen soft nondistended without hepatosplenomegaly masses or tenderness.  Prostate is normal without nodules.  No lower extremity edema.  Neuro intact without focal deficits.  Affect thought and judgment are normal.       Assessment & Plan:  Hyperlipidemia-stable with Crestor 10 mg daily  History of  sleep apnea-currently not wearing CPAP regularly  History of allergic rhinitis and chronic sinusitis followed by Dr. Vicente Masson at [facility]  Recent bilateral cataract extractions in [place] by Dr. Anson Oregon  History of BPH treated with Flomax  History of depression treated with Wellbutrin  History of impaired glucose tolerance with hemoglobin A1c stable at 5.8% with diet and exercise  History of idiopathic peripheral neuropathy  History of Herpes zoster treated with Valtrex  GE reflux treated with  PPI  History of senile purpura-used to be steroid dependent due to allergic rhinitis and chronic sinusitis  Plan: Continue follow-up with Dr. Vicente Masson at [facility].  Recommend annual flu vaccine.  Return in 1 year or as needed.  Subjective:   Patient presents for Medicare Annual/Subsequent preventive examination.  Review Past Medical/Family/Social: See above   Risk Factors  Current exercise habits: Walks regularly Dietary issues discussed: Low-fat low carbohydrate  Cardiac risk factors: Hyperlipidemia  Depression Screen  (Note: if answer to either of the following is "Yes", a more complete depression screening is indicated)   Over the past two weeks, have you felt down, depressed or hopeless? No  Over the past two weeks, have you felt little interest or pleasure in doing things? No Have you lost interest or pleasure in daily life? No Do you often feel hopeless? No Do you cry easily over simple problems? No   Activities of Daily Living  In your present state of health, do you have any difficulty performing the following activities?:   Driving? No  Managing money? No  Feeding yourself? No  Getting from bed to chair? No  Climbing a flight of stairs? No  Preparing food and eating?: No  Bathing or showering? No  Getting dressed: No  Getting to the toilet? No  Using the toilet:No  Moving around from place to place: No  In the past year have you fallen or had a near fall?:  Yes Are you sexually active? No  Do you have more than one partner? No   Hearing Difficulties: Yes Do you often ask people to speak up or repeat themselves?  Yes Do you experience ringing or noises in your ears? No  Do you have difficulty understanding soft or whispered voices? No  Do you feel that you have a problem with memory? No Do you often misplace items? No    Home Safety:  Do you have a smoke alarm at your residence? Yes Do you have grab bars in the bathroom?  Yes Do  you have throw rugs in your house?  No   Cognitive Testing  Alert? Yes Normal Appearance?Yes  Oriented to person? Yes Place? Yes  Time? Yes  Recall of three objects? Yes  Can perform simple calculations? Yes  Displays appropriate judgment?Yes  Can read the correct time from a watch face?Yes   List the Names of Other Physician/Practitioners you currently use:  See referral list for the physicians patient is currently seeing.  Dr. Vicente Masson, [facility]  Urologist at [facility]  Dr. Anson Oregon, ophthalmologist  Dr. [name] at [facility]   Review of Systems: See above   Objective:     General appearance: Appears younger than stated age Head: Normocephalic, without obvious abnormality, atraumatic  Eyes: conj clear, EOMi PEERLA  Ears: normal TM's and external ear canals both ears  Nose: Nares normal. Septum midline. Mucosa normal. No drainage or sinus tenderness.  Throat: lips, mucosa, and tongue normal; teeth  and gums normal  Neck: no adenopathy, no carotid bruit, no JVD, supple, symmetrical, trachea midline and thyroid not enlarged, symmetric, no tenderness/mass/nodules  No CVA tenderness.  Lungs: clear to auscultation bilaterally  Breasts: normal appearance, no masses or tenderness Heart: regular rate and rhythm, S1, S2 normal, no murmur, click, rub or gallop  Abdomen: soft, non-tender; bowel sounds normal; no masses, no organomegaly  Musculoskeletal: ROM normal in all joints, no crepitus, no deformity, Normal muscle strengthen. Back  is symmetric, no curvature. Skin: Skin color, texture, turgor normal. No rashes or lesions  Lymph nodes: Cervical, supraclavicular, and axillary nodes normal.  Neurologic: CN 2 -12 Normal, Normal symmetric reflexes. Normal coordination and gait  Psych: Alert & Oriented x 3, Mood appear stable.    Assessment:    Annual wellness medicare exam   Plan:    During the course of the visit the patient was educated and  counseled about appropriate screening and preventive services including:   Annual flu vaccine  Has had 2 COVID-19 immunizations  Tetanus immunization is up-to-date  Has had [id number] 73 and pneumococcal 23  Due for colonoscopy October 13, 2019 by Dr. Scarlette Shorts     Patient Instructions (the written plan) was given to the patient.  Medicare Attestation  I have personally reviewed:  The patient's medical and social history  Their use of alcohol, tobacco or illicit drugs  Their current medications and supplements  The patient's functional ability including ADLs,fall risks, home safety risks, cognitive, and hearing and visual impairment  Diet and physical activities  Evidence for depression or mood disorders  The patient's weight, height, BMI, and visual acuity have been recorded in the chart. I have made referrals, counseling, and provided education to the patient based on review of the above and I have provided the patient with a written personalized care plan for preventive services.

## 2019-08-14 NOTE — Patient Instructions (Signed)
It was a pleasure to see you today.  Return in 1 year or as needed.  Continue current medications.  Have flu vaccine in the Fall.

## 2019-09-12 DIAGNOSIS — B078 Other viral warts: Secondary | ICD-10-CM | POA: Diagnosis not present

## 2019-09-16 ENCOUNTER — Other Ambulatory Visit: Payer: Self-pay | Admitting: Internal Medicine

## 2019-11-07 ENCOUNTER — Encounter: Payer: Self-pay | Admitting: Internal Medicine

## 2019-11-07 ENCOUNTER — Other Ambulatory Visit: Payer: Self-pay

## 2019-11-07 ENCOUNTER — Ambulatory Visit (INDEPENDENT_AMBULATORY_CARE_PROVIDER_SITE_OTHER): Payer: Medicare PPO | Admitting: Internal Medicine

## 2019-11-07 VITALS — BP 100/60 | HR 64 | Temp 97.9°F | Ht 71.0 in | Wt 185.0 lb

## 2019-11-07 DIAGNOSIS — Z23 Encounter for immunization: Secondary | ICD-10-CM

## 2019-11-07 NOTE — Patient Instructions (Signed)
Patient received a flu vaccine IM L deltoid, AV, CMA  

## 2019-11-14 ENCOUNTER — Ambulatory Visit: Payer: Medicare PPO | Admitting: Internal Medicine

## 2019-11-18 NOTE — Progress Notes (Signed)
Flu Vaccine per CMA 

## 2019-11-19 ENCOUNTER — Other Ambulatory Visit: Payer: Self-pay | Admitting: Surgery

## 2019-11-19 DIAGNOSIS — K644 Residual hemorrhoidal skin tags: Secondary | ICD-10-CM | POA: Diagnosis not present

## 2019-12-21 ENCOUNTER — Other Ambulatory Visit: Payer: Self-pay | Admitting: Surgery

## 2019-12-21 DIAGNOSIS — K644 Residual hemorrhoidal skin tags: Secondary | ICD-10-CM | POA: Diagnosis not present

## 2020-01-28 ENCOUNTER — Ambulatory Visit: Payer: Medicare PPO | Admitting: Podiatry

## 2020-01-28 ENCOUNTER — Other Ambulatory Visit: Payer: Medicare PPO | Admitting: Orthotics

## 2020-02-02 ENCOUNTER — Other Ambulatory Visit: Payer: Self-pay | Admitting: Internal Medicine

## 2020-02-03 ENCOUNTER — Other Ambulatory Visit: Payer: Self-pay | Admitting: Internal Medicine

## 2020-02-05 ENCOUNTER — Ambulatory Visit (INDEPENDENT_AMBULATORY_CARE_PROVIDER_SITE_OTHER): Payer: Medicare PPO | Admitting: Podiatry

## 2020-02-05 ENCOUNTER — Other Ambulatory Visit: Payer: Self-pay

## 2020-02-05 ENCOUNTER — Ambulatory Visit (INDEPENDENT_AMBULATORY_CARE_PROVIDER_SITE_OTHER): Payer: Medicare PPO | Admitting: Orthotics

## 2020-02-05 DIAGNOSIS — Q666 Other congenital valgus deformities of feet: Secondary | ICD-10-CM

## 2020-02-05 DIAGNOSIS — M79673 Pain in unspecified foot: Secondary | ICD-10-CM | POA: Diagnosis not present

## 2020-02-05 DIAGNOSIS — M722 Plantar fascial fibromatosis: Secondary | ICD-10-CM | POA: Diagnosis not present

## 2020-02-05 DIAGNOSIS — M6788 Other specified disorders of synovium and tendon, other site: Secondary | ICD-10-CM

## 2020-02-05 NOTE — Progress Notes (Signed)
Patient had f/o made from orthopedic; it wasn't adequate as it didn't hug arch and he was getting less than optimal results.  Recast and made f/o that more accurantly captures the contour of his foot/arch.

## 2020-02-11 NOTE — Progress Notes (Signed)
Subjective:   Patient ID: Jeffrey Wu., male   DOB: 75 y.o.   MRN: [id number]   HPI 75 year old male presents the office today for concerns of bilateral arch pain.  He states that he has orthotics by another provider he states they are not comfortable.  He denies recent injury or trauma.  Is been a chronic issue for him.  No swelling.  No rating pain or weakness.  Has no other concerns.   Review of Systems  All other systems reviewed and are negative.  Past Medical History:  Diagnosis Date  . Asthma   . Atopic rhinitis   . Chronic sinusitis   . Depression   . Hemorrhoids   . Hernia   . Hyperlipidemia   . OSA (obstructive sleep apnea)     Past Surgical History:  Procedure Laterality Date  . APPENDECTOMY    . FOOT SURGERY     Left  . HERNIA REPAIR    . INGUINAL HERNIA REPAIR  04/22/2011   Right  . NASAL SINUS SURGERY  12/09/10  . THYROGLOSSAL DUCT CYST  1993     Current Outpatient Medications:  .  albuterol (VENTOLIN HFA) 108 (90 Base) MCG/ACT inhaler, Inhale into the lungs., Disp: , Rfl:  .  buPROPion (WELLBUTRIN XL) 150 MG 24 hr tablet, TAKE 1 TABLET BY MOUTH EVERY DAY, Disp: 90 tablet, Rfl: 3 .  Cholecalciferol 50 MCG (2000 UT) CAPS, Take by mouth., Disp: , Rfl:  .  DULERA 100-5 MCG/ACT AERO, INHALE 2 PUFFS BY MOUTH IN THE MORNING & 2 PUFFS IN THE EVENING, Disp: 8.8 g, Rfl: 11 .  dupilumab (DUPIXENT) 300 MG/2ML prefilled syringe, Inject 300 mg into the skin every 14 (fourteen) days. , Disp: , Rfl:  .  Dutasteride-Tamsulosin HCl 0.5-0.4 MG CAPS, TAKE 1 CAPSULE BY MOUTH EVERY DAY, Disp: 90 capsule, Rfl: 1 .  fexofenadine (ALLEGRA) 180 MG tablet, Take 180 mg by mouth., Disp: , Rfl:  .  hydrocortisone (ANUSOL-HC) 25 MG suppository, PLACE 1 SUPPOSITORY (25 MG TOTAL) RECTALLY 2 (TWO) TIMES DAILY., Disp: 12 suppository, Rfl: prn .  montelukast (SINGULAIR) 10 MG tablet, Take 1 tablet (10 mg total) by mouth every evening., Disp: 90 tablet, Rfl: 3 .  omeprazole (PRILOSEC) 40  MG capsule, TAKE 1 CAPSULE (40 MG TOTAL) DAILY BY MOUTH., Disp: 90 capsule, Rfl: 3 .  prednisoLONE acetate (PRED FORTE) 1 % ophthalmic suspension, APPLY 3 DROPS TO EACH SIDE OF NOSE IN HAND HANGING POSITION TWICE A DAY. (OK FOR NASAL USE), Disp: , Rfl: 6 .  rosuvastatin (CRESTOR) 10 MG tablet, TAKE 1 TABLET BY MOUTH EVERY DAY, Disp: 90 tablet, Rfl: 3  Allergies  Allergen Reactions  . Aspirin Shortness Of Breath  . Ibuprofen Shortness Of Breath    Nausea and vomiting also        Objective:  Physical Exam  General: AAO x3, NAD  Dermatological: Skin is warm, dry and supple bilateral.There are no open sores, no preulcerative lesions, no rash or signs of infection present.  Vascular: Dorsalis Pedis artery and Posterior Tibial artery pedal pulses are 2/4 bilateral with immedate capillary fill time. There is no pain with calf compression, swelling, warmth, erythema.   Neruologic: Grossly intact via light touch bilateral. Negative tinel sign.   Musculoskeletal: Mild tenderness palpation on plantar aspect the arch of the foot along the plantar fascia.  Tinea pedis appears to be intact.  Decreased medial arch upon weightbearing.  No pain with lateral compression of calcaneus.  No  area pinpoint tenderness.  No edema, erythema.  Muscular strength 5/5 in all groups tested bilateral.  Gait: Unassisted, Nonantalgic.       Assessment:   Bilateral arch pain, plantar fasciitis     Plan:  -Treatment options discussed including all alternatives, risks, and complications -Etiology of symptoms were discussed -He was measured for new orthotics today by our pedorthotist, Rick.  We did modify the custom orthotics today as well.  Discussed stretching, shoe modifications as well.  Trula Slade DPM

## 2020-02-12 ENCOUNTER — Encounter: Payer: Self-pay | Admitting: Internal Medicine

## 2020-02-22 DIAGNOSIS — Z7982 Long term (current) use of aspirin: Secondary | ICD-10-CM | POA: Diagnosis not present

## 2020-02-22 DIAGNOSIS — J324 Chronic pansinusitis: Secondary | ICD-10-CM | POA: Diagnosis not present

## 2020-02-22 DIAGNOSIS — J339 Nasal polyp, unspecified: Secondary | ICD-10-CM | POA: Diagnosis not present

## 2020-02-22 DIAGNOSIS — Z87891 Personal history of nicotine dependence: Secondary | ICD-10-CM | POA: Diagnosis not present

## 2020-02-22 DIAGNOSIS — Z7952 Long term (current) use of systemic steroids: Secondary | ICD-10-CM | POA: Diagnosis not present

## 2020-02-22 DIAGNOSIS — D7219 Other eosinophilia: Secondary | ICD-10-CM | POA: Diagnosis not present

## 2020-02-22 DIAGNOSIS — Z9889 Other specified postprocedural states: Secondary | ICD-10-CM | POA: Diagnosis not present

## 2020-02-22 DIAGNOSIS — J4550 Severe persistent asthma, uncomplicated: Secondary | ICD-10-CM | POA: Diagnosis not present

## 2020-02-22 DIAGNOSIS — Z79899 Other long term (current) drug therapy: Secondary | ICD-10-CM | POA: Diagnosis not present

## 2020-02-22 DIAGNOSIS — Z8619 Personal history of other infectious and parasitic diseases: Secondary | ICD-10-CM | POA: Diagnosis not present

## 2020-02-22 DIAGNOSIS — J8283 Eosinophilic asthma: Secondary | ICD-10-CM | POA: Diagnosis not present

## 2020-03-03 ENCOUNTER — Other Ambulatory Visit: Payer: Self-pay | Admitting: Internal Medicine

## 2020-03-06 ENCOUNTER — Other Ambulatory Visit: Payer: Self-pay

## 2020-03-06 ENCOUNTER — Ambulatory Visit: Payer: Medicare PPO | Admitting: Podiatry

## 2020-03-06 ENCOUNTER — Other Ambulatory Visit: Payer: Medicare PPO

## 2020-03-06 DIAGNOSIS — M79673 Pain in unspecified foot: Secondary | ICD-10-CM | POA: Diagnosis not present

## 2020-03-06 DIAGNOSIS — M722 Plantar fascial fibromatosis: Secondary | ICD-10-CM

## 2020-03-11 NOTE — Progress Notes (Signed)
Subjective: 75 year old male presents the office today for evaluation of arch discomfort, plantar fasciitis as well as scheduled orthotics.  He states he has been doing well and has no issues with his feet.Denies any systemic complaints such as fevers, chills, nausea, vomiting. No acute changes since last appointment, and no other complaints at this time.   Objective: AAO x3, NAD DP/PT pulses palpable bilaterally, CRT less than 3 seconds At this time there is no area of pinpoint tenderness.  There is no specific area of tenderness identified.  Particular there is no pain with course or insertion of plantar fashion there is no pain on the arch of the foot.  There is no edema, erythema.  MMT 5/5.  No pain with calf compression, swelling, warmth, erythema  Assessment: Plantar fasciitis, arch discomfort  Plan: -All treatment options discussed with the patient including all alternatives, risks, complications.  -Orthotics were dispensed today.  Oral and written break-in instructions provided.  Discussed traction, shoe modifications as well. -Patient encouraged to call the office with any questions, concerns, change in symptoms.   Trula Slade DPM

## 2020-03-27 ENCOUNTER — Ambulatory Visit: Payer: Medicare PPO | Admitting: Podiatry

## 2020-03-27 ENCOUNTER — Other Ambulatory Visit: Payer: Self-pay

## 2020-03-27 DIAGNOSIS — M79673 Pain in unspecified foot: Secondary | ICD-10-CM | POA: Diagnosis not present

## 2020-03-27 DIAGNOSIS — M722 Plantar fascial fibromatosis: Secondary | ICD-10-CM | POA: Diagnosis not present

## 2020-03-30 NOTE — Progress Notes (Signed)
Subjective: 75 year old male presents the office today for follow-up evaluation of plantar fasciitis, arch discomfort.  He states he is not having any pain to his feet as well however on the right side it feels that the arch is slightly too high in the orthotic. Denies any systemic complaints such as fevers, chills, nausea, vomiting. No acute changes since last appointment, and no other complaints at this time.   Objective: AAO x3, NAD DP/PT pulses palpable bilaterally, CRT less than 3 seconds Unable to elicit any area of tenderness today.  No pain on the plantar fascial plantar musculature.  No area of pinpoint tenderness.  Flexor, extensor tendons appear to be intact.  MMT 5/5. No pain with calf compression, swelling, warmth, erythema  Assessment: Arch pain, needing orthotic adjustment  Plan: -All treatment options discussed with the patient including all alternatives, risks, complications.  -I marked the area in the right orthotic which is causing discomfort which is pushing up on the arch.  Is not significant but he can notice it.  I marked the area more and send the orthotics back to the lab for modifications to grind down the metatarsal bar which is pushing up too much on the right side. -Patient encouraged to call the office with any questions, concerns, change in symptoms.   Trula Slade DPM

## 2020-04-13 ENCOUNTER — Encounter: Payer: Self-pay | Admitting: Podiatry

## 2020-06-06 DIAGNOSIS — H02883 Meibomian gland dysfunction of right eye, unspecified eyelid: Secondary | ICD-10-CM | POA: Diagnosis not present

## 2020-06-06 DIAGNOSIS — Z961 Presence of intraocular lens: Secondary | ICD-10-CM | POA: Diagnosis not present

## 2020-06-06 DIAGNOSIS — H02886 Meibomian gland dysfunction of left eye, unspecified eyelid: Secondary | ICD-10-CM | POA: Diagnosis not present

## 2020-06-20 ENCOUNTER — Ambulatory Visit (INDEPENDENT_AMBULATORY_CARE_PROVIDER_SITE_OTHER): Payer: Medicare PPO

## 2020-06-20 ENCOUNTER — Other Ambulatory Visit: Payer: Self-pay

## 2020-06-20 DIAGNOSIS — M722 Plantar fascial fibromatosis: Secondary | ICD-10-CM

## 2020-06-24 NOTE — Progress Notes (Signed)
Patient was seen in office today by EJ with [facility]. Patient required adjustments to be dine to current pair of custom orthotics.

## 2020-07-04 DIAGNOSIS — H90A21 Sensorineural hearing loss, unilateral, right ear, with restricted hearing on the contralateral side: Secondary | ICD-10-CM | POA: Diagnosis not present

## 2020-07-04 DIAGNOSIS — H90A32 Mixed conductive and sensorineural hearing loss, unilateral, left ear with restricted hearing on the contralateral side: Secondary | ICD-10-CM | POA: Diagnosis not present

## 2020-07-10 ENCOUNTER — Encounter: Payer: Self-pay | Admitting: Podiatry

## 2020-08-18 DIAGNOSIS — Z79899 Other long term (current) drug therapy: Secondary | ICD-10-CM | POA: Diagnosis not present

## 2020-08-18 DIAGNOSIS — Z886 Allergy status to analgesic agent status: Secondary | ICD-10-CM | POA: Diagnosis not present

## 2020-08-18 DIAGNOSIS — Z7982 Long term (current) use of aspirin: Secondary | ICD-10-CM | POA: Diagnosis not present

## 2020-08-18 DIAGNOSIS — J339 Nasal polyp, unspecified: Secondary | ICD-10-CM | POA: Diagnosis not present

## 2020-08-18 DIAGNOSIS — J45909 Unspecified asthma, uncomplicated: Secondary | ICD-10-CM | POA: Diagnosis not present

## 2020-08-18 DIAGNOSIS — J45998 Other asthma: Secondary | ICD-10-CM | POA: Diagnosis not present

## 2020-08-18 DIAGNOSIS — Z87891 Personal history of nicotine dependence: Secondary | ICD-10-CM | POA: Diagnosis not present

## 2020-08-18 DIAGNOSIS — T39015A Adverse effect of aspirin, initial encounter: Secondary | ICD-10-CM | POA: Diagnosis not present

## 2020-08-18 DIAGNOSIS — J324 Chronic pansinusitis: Secondary | ICD-10-CM | POA: Diagnosis not present

## 2020-08-18 DIAGNOSIS — H6522 Chronic serous otitis media, left ear: Secondary | ICD-10-CM | POA: Diagnosis not present

## 2020-08-25 ENCOUNTER — Other Ambulatory Visit: Payer: Self-pay | Admitting: Internal Medicine

## 2020-08-25 DIAGNOSIS — J1289 Other viral pneumonia: Secondary | ICD-10-CM

## 2020-10-16 ENCOUNTER — Other Ambulatory Visit: Payer: Medicare PPO | Admitting: Internal Medicine

## 2020-10-16 ENCOUNTER — Other Ambulatory Visit: Payer: Self-pay

## 2020-10-16 DIAGNOSIS — R7302 Impaired glucose tolerance (oral): Secondary | ICD-10-CM

## 2020-10-16 DIAGNOSIS — Z0000 Encounter for general adult medical examination without abnormal findings: Secondary | ICD-10-CM | POA: Diagnosis not present

## 2020-10-16 DIAGNOSIS — Z125 Encounter for screening for malignant neoplasm of prostate: Secondary | ICD-10-CM | POA: Diagnosis not present

## 2020-10-16 DIAGNOSIS — E7800 Pure hypercholesterolemia, unspecified: Secondary | ICD-10-CM | POA: Diagnosis not present

## 2020-10-17 ENCOUNTER — Ambulatory Visit (INDEPENDENT_AMBULATORY_CARE_PROVIDER_SITE_OTHER): Payer: Medicare PPO | Admitting: Internal Medicine

## 2020-10-17 ENCOUNTER — Encounter: Payer: Self-pay | Admitting: Internal Medicine

## 2020-10-17 VITALS — BP 122/68 | HR 69 | Temp 97.8°F | Ht 71.0 in | Wt 190.0 lb

## 2020-10-17 DIAGNOSIS — R7302 Impaired glucose tolerance (oral): Secondary | ICD-10-CM | POA: Diagnosis not present

## 2020-10-17 DIAGNOSIS — G609 Hereditary and idiopathic neuropathy, unspecified: Secondary | ICD-10-CM | POA: Diagnosis not present

## 2020-10-17 DIAGNOSIS — Z23 Encounter for immunization: Secondary | ICD-10-CM

## 2020-10-17 DIAGNOSIS — Z8659 Personal history of other mental and behavioral disorders: Secondary | ICD-10-CM | POA: Diagnosis not present

## 2020-10-17 DIAGNOSIS — N401 Enlarged prostate with lower urinary tract symptoms: Secondary | ICD-10-CM | POA: Diagnosis not present

## 2020-10-17 DIAGNOSIS — Z8709 Personal history of other diseases of the respiratory system: Secondary | ICD-10-CM | POA: Diagnosis not present

## 2020-10-17 DIAGNOSIS — Z9841 Cataract extraction status, right eye: Secondary | ICD-10-CM | POA: Diagnosis not present

## 2020-10-17 DIAGNOSIS — G4733 Obstructive sleep apnea (adult) (pediatric): Secondary | ICD-10-CM | POA: Diagnosis not present

## 2020-10-17 DIAGNOSIS — D692 Other nonthrombocytopenic purpura: Secondary | ICD-10-CM

## 2020-10-17 DIAGNOSIS — E7800 Pure hypercholesterolemia, unspecified: Secondary | ICD-10-CM | POA: Diagnosis not present

## 2020-10-17 DIAGNOSIS — R351 Nocturia: Secondary | ICD-10-CM

## 2020-10-17 DIAGNOSIS — Z0000 Encounter for general adult medical examination without abnormal findings: Secondary | ICD-10-CM | POA: Diagnosis not present

## 2020-10-17 DIAGNOSIS — Z9842 Cataract extraction status, left eye: Secondary | ICD-10-CM

## 2020-10-17 LAB — CBC WITH DIFFERENTIAL/PLATELET
Absolute Monocytes: 646 cells/uL (ref 200–950)
Basophils Absolute: 38 cells/uL (ref 0–200)
Basophils Relative: 0.5 %
Eosinophils Absolute: 1429 cells/uL — ABNORMAL HIGH (ref 15–500)
Eosinophils Relative: 18.8 %
HCT: 47.9 % (ref 38.5–50.0)
Hemoglobin: 15.7 g/dL (ref 13.2–17.1)
Lymphs Abs: 2067 cells/uL (ref 850–3900)
MCH: 31.9 pg (ref 27.0–33.0)
MCHC: 32.8 g/dL (ref 32.0–36.0)
MCV: 97.4 fL (ref 80.0–100.0)
MPV: 11.4 fL (ref 7.5–12.5)
Monocytes Relative: 8.5 %
Neutro Abs: 3420 cells/uL (ref 1500–7800)
Neutrophils Relative %: 45 %
Platelets: 184 10*3/uL (ref 140–400)
RBC: 4.92 10*6/uL (ref 4.20–5.80)
RDW: 11.8 % (ref 11.0–15.0)
Total Lymphocyte: 27.2 %
WBC: 7.6 10*3/uL (ref 3.8–10.8)

## 2020-10-17 LAB — COMPLETE METABOLIC PANEL WITH GFR
AG Ratio: 1.6 (calc) (ref 1.0–2.5)
ALT: 21 U/L (ref 9–46)
AST: 23 U/L (ref 10–35)
Albumin: 4 g/dL (ref 3.6–5.1)
Alkaline phosphatase (APISO): 83 U/L (ref 35–144)
BUN: 20 mg/dL (ref 7–25)
CO2: 30 mmol/L (ref 20–32)
Calcium: 9.5 mg/dL (ref 8.6–10.3)
Chloride: 101 mmol/L (ref 98–110)
Creat: 0.98 mg/dL (ref 0.70–1.28)
Globulin: 2.5 g/dL (calc) (ref 1.9–3.7)
Glucose, Bld: 96 mg/dL (ref 65–99)
Potassium: 5 mmol/L (ref 3.5–5.3)
Sodium: 138 mmol/L (ref 135–146)
Total Bilirubin: 0.5 mg/dL (ref 0.2–1.2)
Total Protein: 6.5 g/dL (ref 6.1–8.1)
eGFR: 80 mL/min/{1.73_m2} (ref >=60)

## 2020-10-17 LAB — POCT URINALYSIS DIPSTICK
Bilirubin, UA: NEGATIVE
Blood, UA: NEGATIVE
Glucose, UA: NEGATIVE
Ketones, UA: NEGATIVE
Leukocytes, UA: NEGATIVE
Nitrite, UA: NEGATIVE
Protein, UA: NEGATIVE
Spec Grav, UA: 1.01 (ref 1.010–1.025)
Urobilinogen, UA: 0.2 E.U./dL
pH, UA: 5 (ref 5.0–8.0)

## 2020-10-17 LAB — LIPID PANEL
Cholesterol: 202 mg/dL — ABNORMAL HIGH (ref <200)
HDL: 72 mg/dL (ref >=40)
LDL Cholesterol (Calc): 108 mg/dL (calc) — ABNORMAL HIGH
Non-HDL Cholesterol (Calc): 130 mg/dL (calc) — ABNORMAL HIGH (ref <130)
Total CHOL/HDL Ratio: 2.8 (calc) (ref <5.0)
Triglycerides: 117 mg/dL (ref <150)

## 2020-10-17 LAB — HEMOGLOBIN A1C
Hgb A1c MFr Bld: 5.7 % of total Hgb — ABNORMAL HIGH (ref <5.7)
Mean Plasma Glucose: 117 mg/dL
eAG (mmol/L): 6.5 mmol/L

## 2020-10-17 LAB — PSA: PSA: 0.3 ng/mL (ref <=4.00)

## 2020-10-17 NOTE — Progress Notes (Signed)
anmSubjective:   Patient presents for Medicare Annual/Subsequent preventive examination.  Risk Factors  Current exercise habits: regular with walking trails and bird watching Dietary issues discussed: low fat low carb  Cardiac risk factors:advanced age (older than 63 for men, 58 for women), dyslipidemia, male gender, and sedentary lifestyle  Depression Screen  (Note: if answer to either of the following is "Yes", a more complete depression screening is indicated)   Over the past two weeks, have you felt down, depressed or hopeless? No Over the past two weeks, have you felt little interest or pleasure in doing things? No Have you lost interest or pleasure in daily life? No Do you often feel hopeless? No Do you cry easily over simple problems? No  Activities of Daily Living  In your present state of health, do you have any difficulty performing the following activities?:   Driving? No Managing money? No Feeding yourself? No Getting from bed to chair?No Climbing a flight of stairs?  No Preparing food and eating?:  No Bathing or showering?   No Getting dressed:  No Getting to the toilet?  No Using the toilet:  No Moving around from place to place:  No In the past year have you fallen or had a near fall?  No Are you sexually active?  No Do you have more than one partner?  No  Hearing Difficulties:    Do you often ask people to speak up or repeat themselves?  NO Do you experience ringing or noises in your ears?   No Do you have difficulty understanding soft or whispered voices?  No Do you feel that you have a problem with memory?  No  Do you often misplace items?  No   Home Safety:  Do you have a smoke alarm at your residence? Yes Do you have grab bars in the bathroom?  Yes Do you have throw rugs in your house?   Yes   Cognitive Testing  Alert? Yes Normal Appearance?Yes  Oriented to person? Yes Place? Yes  Time? Yes  Recall of three objects? Yes  Can perform simple  calculations? Yes  Displays appropriate judgment?Yes  Can read the correct time from a watch face?Yes   List the Names of Other Physician/Practitioners you currently use:  See referral list for the physicians patient is currently seeing.    Patient Instructions (the written plan) was given to the patient.  Medicare Attestation  I have personally reviewed:  The patient's medical and social history  Their use of alcohol, tobacco or illicit drugs  Their current medications and supplements  The patient's functional ability including ADLs,fall risks, home safety risks, cognitive, and hearing and visual impairment  Diet and physical activities  Evidence for depression or mood disorders  The patient's weight, height, BMI, and visual acuity have been recorded in the chart. I have made referrals, counseling, and provided education to the patient based on review of the above and I have provided the patient with a written personalized care plan for preventive services.   Patient here for health maintenance exam and evaluation of medical issues.  Patient here for Health maintenance exam and evaluation of medical issues.  Hx of hemorrhoidal tags and has seen Dr. Nedra Hai.   History of bilateral cataract extractions in [place] in July 2021.  Has been seen at [facility] for chronic sinusitis and allergic rhinitis.  He is on Westfir which works well.  He used to be on chronic steroid therapy.  History of pneumonia while traveling to [place] in 2019.  History of BPH, hyperlipidemia and history of depression.  History of sleep apnea.  Has CPAP device but does not use it regularly.  Past medical history: Appendectomy 1955, thyroglossal duct cyst removed in 1998.  Hernia repair in 2009.  Social history: He is married.  Wife is a retired Land.  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  He quit smoking several years ago but previously smoked  a pack of cigarettes daily for some 46 years.  Consumes alcohol daily.  Family history: Father died at age 59 of leukemia when patient was 22 years old.  Mother died at age 79 of a stroke after heart surgery.  1 brother and 1 sister in good health.  3 adult children, 2 sons and a daughter, all of whom are in good health.  In 2015, he had endoscopic sinus surgery at [facility] by Dr. Vicente Masson.  He had improvement in his sense of smell and decreased nasal drainage.  He was then on a prolonged steroid taper.  In 2016 he had complete loss of smell and increased nasal congestion despite aspirin therapy, nasal steroid and leukotrienes inhibitor.  Endoscopy of the sinuses revealed ethmoid polypoid edema and eosinophilic secretions that were tenacious.  He underwent a repeat oral steroid burst and began prednisone 5 mg daily.  In the Fall 2019 he had bilateral ethmoidectomies, frontal sinusotomies, sphenoidotomies were performed.  He is followed by [facility] pulmonary department.  He has sensorineural hearing loss of right ear with restricted hearing on the left.  He had onset of asthma at age 32.  History of impaired glucose tolerance with stable hemoglobin A1c at 5.7%.  Lipid panel is stable.  LDL increased 99 last year to 108.  History of eosinophilia with eosinophil count 1429 on CBC with differential.  He takes Wellbutrin for depression.  Takes Prilosec for GE reflux.  For asthma uses Dulera, Allegra and Singulair  Takes Crestor 10 mg daily for hyperlipidemia  History of prostatism treated with Flomax  No known drug allergies  Vital signs are reviewed and are stable.  Blood pressure 122/68 BMI 26.50 weight 190 pounds  Skin: Warm and dry.  TMs are clear.  Pharynx is clear.  No cervical adenopathy.  No carotid bruits.  Chest clear to auscultation.  Cardiac exam: Regular rate and rhythm without ectopy.  Abdomen is soft nondistended without  hepatosplenomegaly masses or tenderness.  No lower extremity pitting edema.  Neuro is intact without gross focal deficits.  Prostate is normal but slightly enlarged.  Affect thought and judgment are normal.  Hyperlipidemia-stable with Crestor  History of sleep apnea but does not [place] CPAP regularly  History of allergic rhinitis and chronic sinusitis followed by Dr. Vicente Masson at [facility]  History of bilateral cataract extractions in [place]  History of BPH treated with Flomax  History of depression treated with Wellbutrin  History of impaired glucose tolerance with stable hemoglobin A1c.  Treated with diet and exercise.  History of idiopathic peripheral neuropathy.  History of herpes zoster treated with Valtrex  GE reflux treated with PPI  History of senile purpura-used to be steroid-dependent due to allergic rhinitis and chronic sinusitis  Plan: Continue current medications.  Watch diet and continued exercise.  Follow-up in 1 year or as needed.IElby Showers, MD, have reviewed all documentation for this visit. The documentation on 11/29/20 for  the exam, diagnosis, procedures, and orders are all accurate and complete.

## 2020-11-18 ENCOUNTER — Encounter: Payer: Self-pay | Admitting: Internal Medicine

## 2020-11-20 DIAGNOSIS — H18513 Endothelial corneal dystrophy, bilateral: Secondary | ICD-10-CM | POA: Diagnosis not present

## 2020-11-29 NOTE — Patient Instructions (Signed)
It was a pleasure to see you today.  Continue current medications and follow-up in 1 year or as needed.

## 2021-01-02 ENCOUNTER — Ambulatory Visit (AMBULATORY_SURGERY_CENTER): Payer: Self-pay

## 2021-01-02 ENCOUNTER — Other Ambulatory Visit: Payer: Self-pay

## 2021-01-02 VITALS — Ht 71.0 in | Wt 190.0 lb

## 2021-01-02 DIAGNOSIS — Z86010 Personal history of colonic polyps: Secondary | ICD-10-CM

## 2021-01-02 MED ORDER — NA SULFATE-K SULFATE-MG SULF 17.5-3.13-1.6 GM/177ML PO SOLN: 1.0000 | Freq: Once | ORAL | 0 refills | Status: AC

## 2021-01-02 NOTE — Progress Notes (Signed)
Pre visit completed via phone call; Patient verified anem, DOB, and address; No egg or soy allergy known to patient  No issues known to pt with past sedation with any surgeries or procedures Patient denies ever being told they had issues or difficulty with intubation  No FH of Malignant Hyperthermia Pt is not on diet pills Pt is not on home 02  Pt is not on blood thinners  Pt reports occasional issues with constipation -advised to increase po fluids, activity, fruits/veggies; No A fib or A flutter Pt is fully vaccinated for Covid x 2 + booster; NO PA's for preps discussed with pt in PV today  Discussed with pt there will be an out-of-pocket cost for prep and that varies from $0 to 70 + dollars - pt verbalized understanding  Due to the COVID-19 pandemic we are asking patients to follow certain guidelines in PV and the [place]   Pt aware of COVID protocols and [facility] guidelines

## 2021-01-23 ENCOUNTER — Other Ambulatory Visit: Payer: Self-pay | Admitting: Internal Medicine

## 2021-01-27 ENCOUNTER — Encounter: Payer: Self-pay | Admitting: Internal Medicine

## 2021-02-02 ENCOUNTER — Encounter: Payer: Self-pay | Admitting: Internal Medicine

## 2021-02-02 ENCOUNTER — Other Ambulatory Visit: Payer: Self-pay

## 2021-02-02 ENCOUNTER — Ambulatory Visit (AMBULATORY_SURGERY_CENTER): Payer: Medicare Other | Admitting: Internal Medicine

## 2021-02-02 VITALS — BP 111/48 | HR 53 | Temp 97.7°F | Resp 12 | Ht 71.0 in | Wt 190.0 lb

## 2021-02-02 DIAGNOSIS — D123 Benign neoplasm of transverse colon: Secondary | ICD-10-CM | POA: Diagnosis not present

## 2021-02-02 DIAGNOSIS — D122 Benign neoplasm of ascending colon: Secondary | ICD-10-CM

## 2021-02-02 DIAGNOSIS — D120 Benign neoplasm of cecum: Secondary | ICD-10-CM | POA: Diagnosis not present

## 2021-02-02 DIAGNOSIS — Z86010 Personal history of colonic polyps: Secondary | ICD-10-CM | POA: Diagnosis not present

## 2021-02-02 DIAGNOSIS — D124 Benign neoplasm of descending colon: Secondary | ICD-10-CM

## 2021-02-02 MED ORDER — SODIUM CHLORIDE 0.9 % IV SOLN: 500.0000 mL | Freq: Once | INTRAVENOUS | Status: DC

## 2021-02-02 NOTE — Progress Notes (Signed)
Called to room to assist during endoscopic procedure.  Patient ID and intended procedure confirmed with present staff. Received instructions for my participation in the procedure from the performing physician.  

## 2021-02-02 NOTE — Patient Instructions (Signed)
.  Handout on polyps, diverticulosis given.    YOU HAD AN ENDOSCOPIC PROCEDURE TODAY AT THE [facility]:   Refer to the procedure report that was given to you for any specific questions about what was found during the examination.  If the procedure report does not answer your questions, please call your gastroenterologist to clarify.  If you requested that your care partner not be given the details of your procedure findings, then the procedure report has been included in a sealed envelope for you to review at your convenience later.  YOU SHOULD EXPECT: Some feelings of bloating in the abdomen. Passage of more gas than usual.  Walking can help get rid of the air that was put into your GI tract during the procedure and reduce the bloating. If you had a lower endoscopy (such as a colonoscopy or flexible sigmoidoscopy) you may notice spotting of blood in your stool or on the toilet paper. If you underwent a bowel prep for your procedure, you may not have a normal bowel movement for a few days.  Please Note:  You might notice some irritation and congestion in your nose or some drainage.  This is from the oxygen used during your procedure.  There is no need for concern and it should clear up in a day or so.  SYMPTOMS TO REPORT IMMEDIATELY:   Following lower endoscopy (colonoscopy or flexible sigmoidoscopy):  Excessive amounts of blood in the stool  Significant tenderness or worsening of abdominal pains  Swelling of the abdomen that is new, acute  Fever of 100F or higher   For urgent or emergent issues, a gastroenterologist can be reached at any hour by calling [phone]. Do not use MyChart messaging for urgent concerns.    DIET:  We do recommend a small meal at first, but then you may proceed to your regular diet.  Drink plenty of fluids but you should avoid alcoholic beverages for 24 hours.  ACTIVITY:  You should plan to take it easy for the rest of today and you should NOT  DRIVE or use heavy machinery until tomorrow (because of the sedation medicines used during the test).    FOLLOW UP: Our staff will call the number listed on your records 48-72 hours following your procedure to check on you and address any questions or concerns that you may have regarding the information given to you following your procedure. If we do not reach you, we will leave a message.  We will attempt to reach you two times.  During this call, we will ask if you have developed any symptoms of COVID 19. If you develop any symptoms (ie: fever, flu-like symptoms, shortness of breath, cough etc.) before then, please call [phone].  If you test positive for Covid 19 in the 2 weeks post procedure, please call and report this information to us.    If any biopsies were taken you will be contacted by phone or by letter within the next 1-3 weeks.  Please call us at [phone] if you have not heard about the biopsies in 3 weeks.    SIGNATURES/CONFIDENTIALITY: You and/or your care partner have signed paperwork which will be entered into your electronic medical record.  These signatures attest to the fact that that the information above on your After Visit Summary has been reviewed and is understood.  Full responsibility of the confidentiality of this discharge information lies with you and/or your care-partner. 

## 2021-02-02 NOTE — Op Note (Signed)
[place] Patient Name: Jeffrey Wu Procedure Date: 02/02/2021 8:39 AM MRN: [id number] Endoscopist: Docia Chuck. Jeffrey Wu , MD Age: 76 Referring MD:  Date of Birth: [birth year] Gender: Male Account #: [id number] Procedure:                Colonoscopy with cold snare polypectomy x 8 Indications:              High risk colon cancer surveillance: Personal                            history of multiple (3 or more) adenomas. Previous                            examination October 2018 (6 polyps) Medicines:                Monitored Anesthesia Care Procedure:                Pre-Anesthesia Assessment:                           - Prior to the procedure, a History and Physical                            was performed, and patient medications and                            allergies were reviewed. The patient's tolerance of                            previous anesthesia was also reviewed. The risks                            and benefits of the procedure and the sedation                            options and risks were discussed with the patient.                            All questions were answered, and informed consent                            was obtained. Prior Anticoagulants: The patient has                            taken no previous anticoagulant or antiplatelet                            agents. ASA Grade Assessment: II - A patient with                            mild systemic disease. After reviewing the risks                            and benefits, the patient was deemed in  satisfactory condition to undergo the procedure.                           After obtaining informed consent, the colonoscope                            was passed under direct vision. Throughout the                            procedure, the patient's blood pressure, pulse, and                            oxygen saturations were monitored continuously. The                             Colonoscope was introduced through the anus and                            advanced to the the cecum, identified by                            appendiceal orifice and ileocecal valve. The                            ileocecal valve, appendiceal orifice, and rectum                            were photographed. The quality of the bowel                            preparation was excellent. The colonoscopy was                            performed without difficulty. The patient tolerated                            the procedure well. The bowel preparation used was                            EXTENSIVE 2 DAY with MIRALAX followed by SUPREP via                            split dose instruction. Scope In: 9:03:15 AM Scope Out: 9:27:28 AM Scope Withdrawal Time: 0 hours 20 minutes 3 seconds  Total Procedure Duration: 0 hours 24 minutes 13 seconds  Findings:                 Eight polyps were found in the descending colon,                            transverse colon, ascending colon and cecum. The                            polyps were 1 to 7 mm in size. These polyps were  removed with a cold snare. Resection and retrieval                            were complete.                           Multiple diverticula were found in the sigmoid                            colon.                           Internal hemorrhoids were found during retroflexion.                           The exam was otherwise without abnormality on                            direct and retroflexion views. Complications:            No immediate complications. Estimated blood loss:                            None. Estimated Blood Loss:     Estimated blood loss: none. Impression:               - Eight 1 to 7 mm polyps in the descending colon,                            in the transverse colon, in the ascending colon and                            in the cecum, removed with a cold snare. Resected                             and retrieved.                           - Diverticulosis in the sigmoid colon.                           - Internal hemorrhoids.                           - The examination was otherwise normal on direct                            and retroflexion views. Recommendation:           - Repeat colonoscopy in 3 years for surveillance.                            SAME EXTENSIVE PREP                           - Patient has a contact number available for  emergencies. The signs and symptoms of potential                            delayed complications were discussed with the                            patient. Return to normal activities tomorrow.                            Written discharge instructions were provided to the                            patient.                           - Resume previous diet.                           - Continue present medications.                           - Await pathology results. Docia Chuck. Jeffrey Pastor, MD 02/02/2021 9:34:23 AM This report has been signed electronically.

## 2021-02-02 NOTE — Progress Notes (Signed)
HISTORY OF PRESENT ILLNESS:  Jeffrey Wu. is a 76 y.o. male with a history of multiple adenomatous colon polyps on colonoscopy 2018.  Presents today for surveillance colonoscopy.  No complaints  REVIEW OF SYSTEMS:  All non-GI ROS negative.  Past Medical History:  Diagnosis Date   Asthma    moderate asthma-inhalant =uses inhaler daily   Atopic rhinitis    Cataract    bilateral sx   Chronic sinusitis    Depression    on meds   GERD (gastroesophageal reflux disease)    on meds   Hemorrhoids    Hernia    Hyperlipidemia    on meds   OSA (obstructive sleep apnea)    Seasonal allergies    Sleep apnea    does not use CPAP at this time (01/02/2021)    Past Surgical History:  Procedure Laterality Date   APPENDECTOMY     CATARACT EXTRACTION, BILATERAL     COLONOSCOPY  2018   JP-MAC-suprep(fair)tics/hems/TA x 6-3 yr recall   FOOT SURGERY     Left   HERNIA REPAIR     INGUINAL HERNIA REPAIR  04/22/2011   Right   NASAL SINUS SURGERY  12/09/2010   x 3   THYROGLOSSAL DUCT CYST  01/05/1991    Social History [employer].  reports that he quit smoking about 14 years ago. His smoking use included cigarettes. He has a 50.00 pack-year smoking history. He has never used smokeless tobacco. He reports current alcohol use of about 21.0 standard drinks per week. He reports that he does not use drugs.  family history includes Cancer in his father; Heart disease in his mother; Stroke in his mother.  Allergies  Allergen Reactions   Aspirin Shortness Of Breath   Ibuprofen Shortness Of Breath and Nausea And Vomiting       PHYSICAL EXAMINATION:  Vital signs: BP (!) 153/81    Pulse 63    Temp 97.7 F (36.5 C) (Temporal)    Ht _0  (1.803 m)    Wt 190 lb (86.2 kg)    SpO2 96%    BMI 26.50 kg/m  General: Well-developed, well-nourished, no acute distress HEENT: Sclerae are anicteric, conjunctiva pink. Oral mucosa intact Lungs: Clear Heart: Regular Abdomen: soft, nontender,  nondistended, no obvious ascites, no peritoneal signs, normal bowel sounds. No organomegaly. Extremities: No edema Psychiatric: alert and oriented x3. Cooperative     ASSESSMENT:  1.  History of multiple adenomatous colon polyps.   PLAN:   1.  Surveillance colonoscopy

## 2021-02-02 NOTE — Progress Notes (Signed)
Sedate, gd SR, tolerated procedure well, VSS, report to RN 

## 2021-02-02 NOTE — Progress Notes (Signed)
Pt's states no medical or surgical changes since previsit or office visit.  VS CW  

## 2021-02-04 ENCOUNTER — Other Ambulatory Visit: Payer: Self-pay | Admitting: Internal Medicine

## 2021-02-04 ENCOUNTER — Telehealth: Payer: Self-pay

## 2021-02-04 NOTE — Telephone Encounter (Signed)
°  Follow up Call-  Call back number 02/02/2021  Post procedure Call Back phone  # [phone]  Permission to leave phone message Yes  Some recent data might be hidden   Post procedure f/u call attempted, no answer, left voicemail

## 2021-02-04 NOTE — Telephone Encounter (Signed)
°  Follow up Call-  Call back number 02/02/2021  Post procedure Call Back phone  # [phone]  Permission to leave phone message Yes  Some recent data might be hidden     Patient questions:  Do you have a fever, pain , or abdominal swelling? No. Pain Score  0 *  Have you tolerated food without any problems? Yes.    Have you been able to return to your normal activities? Yes.    Do you have any questions about your discharge instructions: Diet   No. Medications  No. Follow up visit  No.  Do you have questions or concerns about your Care? No.  Actions: * If pain score is 4 or above: No action needed, pain <4.  Have you developed a fever since your procedure? no  2.   Have you had an respiratory symptoms (SOB or cough) since your procedure? no  3.   Have you tested positive for COVID 19 since your procedure no  4.   Have you had any family members/close contacts diagnosed with the COVID 19 since your procedure?  no   If yes to any of these questions please route to Joylene John, RN and Joella Prince, RN

## 2021-02-05 ENCOUNTER — Encounter: Payer: Self-pay | Admitting: Internal Medicine

## 2021-03-15 ENCOUNTER — Other Ambulatory Visit: Payer: Self-pay | Admitting: Internal Medicine

## 2021-04-07 ENCOUNTER — Other Ambulatory Visit: Payer: Self-pay | Admitting: Internal Medicine

## 2021-04-07 NOTE — Telephone Encounter (Signed)
Scheduled CPE 

## 2021-04-29 ENCOUNTER — Telehealth: Payer: Self-pay

## 2021-04-29 NOTE — Telephone Encounter (Signed)
Patient complains of persistant sinus infection since having Covid in Feb. Hx of recurrent sinus infections. He has congestion and productive cough. Appt made for tomorrow at 1230.  ?

## 2021-04-30 ENCOUNTER — Ambulatory Visit: Payer: Medicare Other | Admitting: Internal Medicine

## 2021-04-30 ENCOUNTER — Encounter: Payer: Self-pay | Admitting: Internal Medicine

## 2021-04-30 ENCOUNTER — Other Ambulatory Visit: Payer: Self-pay | Admitting: Internal Medicine

## 2021-04-30 ENCOUNTER — Telehealth: Payer: Self-pay

## 2021-04-30 VITALS — BP 128/68 | HR 68 | Temp 97.5°F

## 2021-04-30 DIAGNOSIS — Z8616 Personal history of COVID-19: Secondary | ICD-10-CM | POA: Diagnosis not present

## 2021-04-30 DIAGNOSIS — Z8709 Personal history of other diseases of the respiratory system: Secondary | ICD-10-CM | POA: Diagnosis not present

## 2021-04-30 DIAGNOSIS — J339 Nasal polyp, unspecified: Secondary | ICD-10-CM

## 2021-04-30 DIAGNOSIS — J320 Chronic maxillary sinusitis: Secondary | ICD-10-CM

## 2021-04-30 DIAGNOSIS — R058 Other specified cough: Secondary | ICD-10-CM | POA: Diagnosis not present

## 2021-04-30 MED ORDER — CEFTRIAXONE SODIUM 1 G IJ SOLR
1.0000 g | Freq: Once | INTRAMUSCULAR | Status: AC
  Administered 2021-04-30: 1 g via INTRAMUSCULAR

## 2021-04-30 MED ORDER — METHYLPREDNISOLONE 4 MG PO TABS: ORAL_TABLET | ORAL | 0 refills | Status: DC

## 2021-04-30 MED ORDER — PREDNISONE 10 MG PO TABS: ORAL_TABLET | ORAL | 0 refills | Status: DC

## 2021-04-30 NOTE — Patient Instructions (Signed)
1 g IM Rocephin given in office today.  Take prednisone in tapering course 10 mg tablets starting with 6 tablets day 1 and decreasing by 1 tablet daily.  Continue Singulair.  Continues Dupixent. ?

## 2021-04-30 NOTE — Progress Notes (Signed)
? ?  Subjective:  ? ? Patient ID: Jeffrey Wu., male    DOB: September 04, 1945, 76 y.o.   MRN: [id number] ? ?HPI  76 year old Male with history of Covid 31 in February.  Since that time he has had persistent sinus congestion and a phlegmy cough.  Currently no fever or shaking chills.  He has a history of recurrent sinus infections.  History of being on Dupixent for recurrent sinus infections.  He has not had a sinus infection in some time. ? ? ? ?Review of Systems discolored nasal drainage. Some cough. No fever or chills. [facility] and came down with cough and drainage. ? ?   ?Objective:  ? Physical Exam ?Blood pressure 128/68 pulse 68 temperature 97.5 degrees by tympanic thermometer pulse oximetry 97% ? ?Skin: Warm and dry.  No cervical adenopathy.  TMs clear.  Neck supple.  Chest clear to auscultation without rales or wheezing. ? ? ? ?   ?Assessment & Plan:  ?Protracted cough status post COVID-19 infection consistent with lower respiratory infection ? ?Maxillary sinusitis ? ?History of nasal polyps ? ?History of aspirin sensitivity ? ?History of chronic pansinusitis seen by Dr. Melba Coon [facility] ? ?Plan: We are going to try prednisone in tapering course 10 mg tablets starting with 6 tablets day 1 and decreasing by 1 tablet daily i.e. 6-5-4-3-2-1.  He will continue with Singulair and Dupixent.  He will call if not better in 10 days to 2 weeks.  He was given 1 g IM Rocephin. ? ?

## 2021-04-30 NOTE — Telephone Encounter (Signed)
Called pharmacy and have cancelled the medrol dose pack since the insurance would not cover it.  ?

## 2021-09-08 ENCOUNTER — Other Ambulatory Visit: Payer: Self-pay | Admitting: Internal Medicine

## 2021-10-19 ENCOUNTER — Other Ambulatory Visit: Payer: Medicare Other

## 2021-10-19 DIAGNOSIS — E7800 Pure hypercholesterolemia, unspecified: Secondary | ICD-10-CM

## 2021-10-19 DIAGNOSIS — Z8659 Personal history of other mental and behavioral disorders: Secondary | ICD-10-CM

## 2021-10-19 DIAGNOSIS — R7302 Impaired glucose tolerance (oral): Secondary | ICD-10-CM

## 2021-10-19 DIAGNOSIS — E785 Hyperlipidemia, unspecified: Secondary | ICD-10-CM

## 2021-10-19 DIAGNOSIS — Z125 Encounter for screening for malignant neoplasm of prostate: Secondary | ICD-10-CM

## 2021-10-20 ENCOUNTER — Ambulatory Visit (INDEPENDENT_AMBULATORY_CARE_PROVIDER_SITE_OTHER): Payer: Medicare Other | Admitting: Internal Medicine

## 2021-10-20 ENCOUNTER — Encounter: Payer: Self-pay | Admitting: Internal Medicine

## 2021-10-20 VITALS — BP 116/70 | HR 65 | Temp 97.7°F | Ht 72.0 in | Wt 194.1 lb

## 2021-10-20 DIAGNOSIS — R7302 Impaired glucose tolerance (oral): Secondary | ICD-10-CM

## 2021-10-20 DIAGNOSIS — N401 Enlarged prostate with lower urinary tract symptoms: Secondary | ICD-10-CM

## 2021-10-20 DIAGNOSIS — G609 Hereditary and idiopathic neuropathy, unspecified: Secondary | ICD-10-CM

## 2021-10-20 DIAGNOSIS — E7800 Pure hypercholesterolemia, unspecified: Secondary | ICD-10-CM

## 2021-10-20 DIAGNOSIS — Z9841 Cataract extraction status, right eye: Secondary | ICD-10-CM

## 2021-10-20 DIAGNOSIS — R351 Nocturia: Secondary | ICD-10-CM

## 2021-10-20 DIAGNOSIS — Z8709 Personal history of other diseases of the respiratory system: Secondary | ICD-10-CM

## 2021-10-20 DIAGNOSIS — J339 Nasal polyp, unspecified: Secondary | ICD-10-CM

## 2021-10-20 DIAGNOSIS — Z8659 Personal history of other mental and behavioral disorders: Secondary | ICD-10-CM | POA: Diagnosis not present

## 2021-10-20 DIAGNOSIS — Z0000 Encounter for general adult medical examination without abnormal findings: Secondary | ICD-10-CM

## 2021-10-20 DIAGNOSIS — G4733 Obstructive sleep apnea (adult) (pediatric): Secondary | ICD-10-CM

## 2021-10-20 DIAGNOSIS — Z9842 Cataract extraction status, left eye: Secondary | ICD-10-CM

## 2021-10-20 LAB — CBC WITH DIFFERENTIAL/PLATELET
Absolute Monocytes: 619 cells/uL (ref 200–950)
Basophils Absolute: 58 cells/uL (ref 0–200)
Basophils Relative: 0.8 %
Eosinophils Absolute: 1404 cells/uL — ABNORMAL HIGH (ref 15–500)
Eosinophils Relative: 19.5 %
HCT: 45.7 % (ref 38.5–50.0)
Hemoglobin: 15.2 g/dL (ref 13.2–17.1)
Lymphs Abs: 1778 cells/uL (ref 850–3900)
MCH: 32.7 pg (ref 27.0–33.0)
MCHC: 33.3 g/dL (ref 32.0–36.0)
MCV: 98.3 fL (ref 80.0–100.0)
MPV: 11.3 fL (ref 7.5–12.5)
Monocytes Relative: 8.6 %
Neutro Abs: 3341 cells/uL (ref 1500–7800)
Neutrophils Relative %: 46.4 %
Platelets: 172 10*3/uL (ref 140–400)
RBC: 4.65 10*6/uL (ref 4.20–5.80)
RDW: 12.1 % (ref 11.0–15.0)
Total Lymphocyte: 24.7 %
WBC: 7.2 10*3/uL (ref 3.8–10.8)

## 2021-10-20 LAB — COMPLETE METABOLIC PANEL WITH GFR
AG Ratio: 1.5 (calc) (ref 1.0–2.5)
ALT: 29 U/L (ref 9–46)
AST: 27 U/L (ref 10–35)
Albumin: 4.1 g/dL (ref 3.6–5.1)
Alkaline phosphatase (APISO): 84 U/L (ref 35–144)
BUN: 20 mg/dL (ref 7–25)
CO2: 27 mmol/L (ref 20–32)
Calcium: 9.6 mg/dL (ref 8.6–10.3)
Chloride: 102 mmol/L (ref 98–110)
Creat: 0.95 mg/dL (ref 0.70–1.28)
Globulin: 2.7 g/dL (calc) (ref 1.9–3.7)
Glucose, Bld: 91 mg/dL (ref 65–99)
Potassium: 4.9 mmol/L (ref 3.5–5.3)
Sodium: 136 mmol/L (ref 135–146)
Total Bilirubin: 0.5 mg/dL (ref 0.2–1.2)
Total Protein: 6.8 g/dL (ref 6.1–8.1)
eGFR: 82 mL/min/{1.73_m2} (ref >=60)

## 2021-10-20 LAB — MICROALBUMIN, URINE: Microalb, Ur: <0.2 mg/dL

## 2021-10-20 LAB — LIPID PANEL
Cholesterol: 206 mg/dL — ABNORMAL HIGH (ref <200)
HDL: 77 mg/dL (ref >=40)
LDL Cholesterol (Calc): 114 mg/dL (calc) — ABNORMAL HIGH
Non-HDL Cholesterol (Calc): 129 mg/dL (calc) (ref <130)
Total CHOL/HDL Ratio: 2.7 (calc) (ref <5.0)
Triglycerides: 67 mg/dL (ref <150)

## 2021-10-20 LAB — POCT URINALYSIS DIPSTICK
Bilirubin, UA: NEGATIVE
Blood, UA: NEGATIVE
Glucose, UA: NEGATIVE
Ketones, UA: NEGATIVE
Leukocytes, UA: NEGATIVE
Nitrite, UA: NEGATIVE
Protein, UA: NEGATIVE
Spec Grav, UA: 1.01 (ref 1.010–1.025)
Urobilinogen, UA: 0.2 E.U./dL
pH, UA: 5 (ref 5.0–8.0)

## 2021-10-20 LAB — HEMOGLOBIN A1C
Hgb A1c MFr Bld: 6.1 % of total Hgb — ABNORMAL HIGH (ref <5.7)
Mean Plasma Glucose: 128 mg/dL
eAG (mmol/L): 7.1 mmol/L

## 2021-10-20 LAB — PSA: PSA: 0.34 ng/mL (ref <=4.00)

## 2021-10-20 NOTE — Progress Notes (Signed)
Annual Wellness Visit     Patient: Jeffrey Wu., Male    DOB: 10/26/1945, 76 y.o.   MRN: [id number] Visit Date: 10/20/2021   Subjective    Jeffrey Wu. is a pleasant  76 y.o. M who presents today for his Annual Wellness Visit.  HPI He also presents for health maintenance exam and evaluation of medical issues.  Saw Dr. Junious Silk recently regarding BPH and will be seen in 6 months. Has weak stream and BPH. Takes Dutesteride-tamsulosin.  He has a history of hyperlipidemia and depression.  History of sleep apnea.  Has CPAP device but does not use it regularly.  In the fall 2019 he had bilateral ethmoidectomies, frontal sinusotomies and sphenoidotomies performed.  He is followed by [facility] pulmonary department and is on [place].  He used to be on chronic steroid therapy for chronic sinusitis and allergic rhinitis.  History of hemorrhoidal tags and is seeing Dr. Nedra Hai.  History of bilateral cataract extractions in [place] in July 2021.  Past medical history: Appendectomy 1955, thyroglossal duct cyst removed in 1998.  Hernia repair 2009.    He has sensorineural hearing loss right ear with restricted hearing on the left.  History of impaired glucose tolerance with stable hemoglobin A1c of 6.1% and previously was 5.7% in October 2022.  Needs to watch diet just a bit.  History of eosinophilia with eosinophil count being 1404 and last year was 1429.  He takes Wellbutrin for depression.  Takes Prilosec for GE reflux.  For asthma uses Dulera, Allegra and Singulair.  Takes Crestor 10 mg daily for hyperlipidemia.  No known drug allergies.  Social history: He is married.  Wife is a retired Land.  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  He quit smoking several years ago but previously smoked a pack of cigarettes daily for some 46 years.  Consumes alcohol daily.  Family history: Father died at age 70 of  leukemia when patient was 37 years old.  Mother died at age 88 of a stroke after heart surgery.  1 brother and 1 sister in good health.  3 adult children, 2 sons and a daughter all of whom are in good health.  In 2015 he had endoscopic sinus surgery at [facility] by Dr. Vicente Masson.  He had improvement in his sense of smell and decreased nasal drainage.  He was then on a prolonged steroid taper.  In 2016 he had a complete loss of smell and increased nasal congestion despite aspirin therapy, nasal steroid and leukotriene inhibitor.  Endoscopy of the sinuses revealed ethmoid polypoid edema and eosinophilic secretions that were tenacious.  He underwent a repeat oral steroid burst and began prednisone 5 mg daily.  In the fall 2019 he had bilateral ethmoidectomies, frontal sinusotomies, sphenoidotomies performed.      Review of Systems no new complaints   Objective    Vitals: Blood pressure excellent at 116/70, pulse 65 temperature 97.7 degrees pulse oximetry 95% weight 194 pounds 1.9 ounces height 6 feet 0 inches BMI 26.33  Physical Exam skin: Warm and dry.  No cervical adenopathy.  No carotid bruits.  No thyromegaly.  Chest clear.  Cardiac exam: Regular rate and rhythm.  Abdomen is soft nondistended without hepatosplenomegaly masses or tenderness.  Rectal exam deferred to urologist.  No lower extremity pitting edema.  Neuro is intact without gross focal deficits.   Most recent functional status assessment:  No data to display         Most recent fall risk assessment:    10/17/2020    3:16 PM  [place] in the past year? 0  Number falls in past yr: 0  Injury with Fall? 0  Risk for fall due to : No Fall Risks  Follow up Falls evaluation completed    Most recent depression screenings:    10/17/2020    3:16 PM 08/14/2019    2:17 PM  PHQ 2/9 Scores  PHQ - 2 Score 0 0  PHQ- 9 Score  0   Most recent cognitive screening:    10/17/2020    3:17 PM  6CIT  Screen  What Year? 0 points  What month? 0 points  What time? 0 points  Count back from 20 0 points  Months in reverse 0 points  Repeat phrase 2 points  Total Score 2 points       Assessment & Plan     Annual wellness visit done today including the all of the following: Reviewed patient's Family Medical History Reviewed and updated list of patient's medical providers Assessment of cognitive impairment was done Assessed patient's functional ability Established a written schedule for health screening [place] Completed and Reviewed  Discussed health benefits of physical activity, and encouraged him to engage in regular exercise appropriate for his age and condition.         IElby Showers, MD, have reviewed all documentation for this visit. The documentation on 10/20/21 for the exam, diagnosis, procedures, and orders are all accurate and complete.   Jeffrey Wu, CMA

## 2021-11-03 NOTE — Patient Instructions (Addendum)
It was a pleasure to see you today.  Labs are stable.  Watch diet a bit and return in 1 year or as needed.  Recommend high-dose flu vaccine.  Recommend COVID booster with was done in September 2023.  Consider RSV vaccine and pneumococcal 20 vaccine.

## 2021-11-05 ENCOUNTER — Encounter: Payer: Self-pay | Admitting: Internal Medicine

## 2021-11-13 ENCOUNTER — Other Ambulatory Visit: Payer: Self-pay | Admitting: Internal Medicine

## 2021-11-13 DIAGNOSIS — J1289 Other viral pneumonia: Secondary | ICD-10-CM

## 2021-11-30 ENCOUNTER — Other Ambulatory Visit: Payer: Self-pay | Admitting: Internal Medicine

## 2021-11-30 DIAGNOSIS — J1289 Other viral pneumonia: Secondary | ICD-10-CM

## 2022-01-28 ENCOUNTER — Other Ambulatory Visit: Payer: Self-pay | Admitting: Internal Medicine

## 2022-02-04 ENCOUNTER — Other Ambulatory Visit: Payer: Self-pay | Admitting: Internal Medicine

## 2022-03-17 ENCOUNTER — Other Ambulatory Visit: Payer: Self-pay | Admitting: Internal Medicine

## 2022-03-18 ENCOUNTER — Other Ambulatory Visit: Payer: Self-pay | Admitting: Internal Medicine

## 2022-08-06 ENCOUNTER — Other Ambulatory Visit: Payer: Self-pay | Admitting: Internal Medicine

## 2022-08-09 ENCOUNTER — Other Ambulatory Visit: Payer: Self-pay | Admitting: Internal Medicine

## 2022-08-09 ENCOUNTER — Other Ambulatory Visit: Payer: Self-pay

## 2022-08-09 MED ORDER — BUPROPION HCL ER (XL) 150 MG PO TB24: 150.0000 mg | ORAL_TABLET | Freq: Every day | ORAL | 0 refills | Status: DC

## 2022-08-09 NOTE — Telephone Encounter (Signed)
CPE scheduled  

## 2022-10-19 NOTE — Progress Notes (Signed)
Annual Wellness Visit    Patient Care Team: Margaree Mackintosh, MD as PCP - General (Internal Medicine)  Visit Date: 10/26/22   Chief Complaint  Patient presents with   Medicare Wellness   Annual Exam    Subjective:   Patient: Jeffrey Wu., Male    DOB: [birth year], 77 y.o.   MRN: [id number]  Wynton Elstad. is a 77 y.o. Male who presents today for his Annual Wellness Visit. History of asthma, chronic rhinitis, bilateral cataract, depression, GERD, hemorrhoids, hernia, hyperlipidemia, obstructive sleep apnea, seasonal allergies.  History of hyperlipidemia treated with rosuvastatin 10 mg daily. Lipid panel normal today.  History of GERD treated with omeprazole 40 mg daily. Has some reflux but omeprazole is helping.  History of chronic rhinitis, asthma, seasonal allergies treated with Allegra, Flonase, Singulair, dupilumab 300 mg injection every two weeks, Dulera inhaler.  History of depression treated with bupropion 150 mg daily.  Saw Dr. Mena Goes recently regarding BPH and will be seen in 6 months. Has weak stream and BPH. History of sleep apnea.  Has CPAP device but does not use it regularly.  In the fall 2019 he had bilateral ethmoidectomies, frontal sinusotomies and sphenoidotomies performed.  He is followed by [facility] pulmonary department and is on Dupixent.  He used to be on chronic steroid therapy for chronic sinusitis and allergic rhinitis.   History of hemorrhoidal tags and is seeing Dr. Carman Ching.   History of bilateral cataract extractions in [place] in July 2021.   Past medical history: Appendectomy 1955, thyroglossal duct cyst removed in 1998.  Hernia repair 2009.   He has sensorineural hearing loss right ear with restricted hearing on the left.   History of impaired glucose tolerance with stable hemoglobin A1c of 6.2% on 10/21/22, up from 6.1% on 10/19/21.   History of eosinophilia with eosinophil count being 1,287.  No  known drug allergies.  Reports he has had his annual eye exam with a local specialist. Reports he has upcoming cataract surgery.  In 2015 he had endoscopic sinus surgery at [facility] by Dr. Hollace Hayward. He had improvement in his sense of smell and decreased nasal drainage. He was then on a prolonged steroid taper. In 2016 he had a complete loss of smell and increased nasal congestion despite aspirin therapy, nasal steroid and leukotriene inhibitor. Endoscopy of the sinuses revealed ethmoid polypoid edema and eosinophilic secretions that were tenacious. He underwent a repeat oral steroid burst and began prednisone 5 mg daily. In the fall 2019 he had bilateral ethmoidectomies, frontal sinusotomies, sphenoidotomies performed.   Labs reviewed today. Glucose normal. Kidney, liver functions normal. Electrolytes normal. Blood proteins normal. PSA at 0.37.  Colonoscopy last completed 02/02/21. Showed eight 1-7 mm polyps in descending, transverse, ascending colon, cecum, diverticulosis in sigmoid colon, internal hemorrhoids. Pathology showed 8 tubular adenomas. Repeat recommended in 2026.  Social history: He is married.  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  He quit smoking several years ago but previously smoked a pack of cigarettes daily for some 46 years.  Consumes alcohol daily.   Family history: Father died at age 53 of leukemia when patient was 35 years old.  Mother died at age 31 of a stroke after heart surgery.  1 brother and 1 sister in good health.  3 adult children, 2 sons and a daughter all of whom are in good  health.  Past Medical History:  Diagnosis Date   Asthma    moderate asthma-inhalant =uses inhaler daily   Atopic rhinitis    Cataract    bilateral sx   Chronic sinusitis    Depression    on meds   GERD (gastroesophageal reflux disease)    on meds   Hemorrhoids    Hernia    Hyperlipidemia    on meds    OSA (obstructive sleep apnea)    Seasonal allergies    Sleep apnea    does not use CPAP at this time (01/02/2021)     Family History  Problem Relation Age of Onset   Heart disease Mother    Stroke Mother    Cancer Father        leukemia   Colon polyps Neg Hx    Colon cancer Neg Hx    Esophageal cancer Neg Hx    Rectal cancer Neg Hx    Stomach cancer Neg Hx      Social Hx: reviewed and unchanged    Review of Systems  Constitutional:  Negative for chills, fever, malaise/fatigue and weight loss.  HENT:  Negative for hearing loss, sinus pain and sore throat.   Respiratory:  Negative for cough, hemoptysis and shortness of breath.   Cardiovascular:  Negative for chest pain, palpitations, leg swelling and PND.  Gastrointestinal:  Positive for heartburn. Negative for abdominal pain, constipation, diarrhea, nausea and vomiting.  Genitourinary:  Negative for dysuria, frequency and urgency.  Musculoskeletal:  Negative for back pain, myalgias and neck pain.  Skin:  Negative for itching and rash.  Neurological:  Negative for dizziness, tingling, seizures and headaches.  Endo/Heme/Allergies:  Negative for polydipsia.  Psychiatric/Behavioral:  Negative for depression. The patient is not nervous/anxious.       Objective:   Vitals: BP 130/68   Pulse 60   Ht 6' (1.829 m)   Wt 196 lb (88.9 kg)   SpO2 96%   BMI 26.58 kg/m   Physical Exam Vitals and nursing note reviewed.  Constitutional:      General: He is awake. He is not in acute distress.    Appearance: Normal appearance. He is not ill-appearing or toxic-appearing.  HENT:     Head: Normocephalic and atraumatic.     Right Ear: Hearing, tympanic membrane, ear canal and external ear normal.     Left Ear: Hearing, tympanic membrane, ear canal and external ear normal.     Mouth/Throat:     Pharynx: Oropharynx is clear.  Eyes:     Extraocular Movements: Extraocular movements intact.     Pupils: Pupils are equal, round, and  reactive to light.  Neck:     Thyroid: No thyroid mass, thyromegaly or thyroid tenderness.     Vascular: No carotid bruit.  Cardiovascular:     Rate and Rhythm: Normal rate and regular rhythm. No extrasystoles are present.    Pulses:          Dorsalis pedis pulses are 2+ on the right side and 2+ on the left side.       Posterior tibial pulses are 2+ on the right side and 2+ on the left side.     Heart sounds: Normal heart sounds. No murmur heard.    No friction rub. No gallop.  Pulmonary:     Effort: Pulmonary effort is normal.     Breath sounds: Normal breath sounds. No decreased breath sounds, wheezing, rhonchi or rales.  Chest:  Chest wall: No mass.  Abdominal:     Palpations: Abdomen is soft.     Tenderness: There is no abdominal tenderness.     Hernia: No hernia is present.  Genitourinary:    Prostate: Normal. Not enlarged and no nodules present.     Comments: Prostate smooth and symmetrical. Musculoskeletal:     Cervical back: Normal range of motion.     Right lower leg: No edema.     Left lower leg: No edema.  Lymphadenopathy:     Cervical: No cervical adenopathy.     Upper Body:     Right upper body: No supraclavicular adenopathy.     Left upper body: No supraclavicular adenopathy.  Skin:    General: Skin is warm and dry.  Neurological:     General: No focal deficit present.     Mental Status: He is alert and oriented to person, place, and time. Mental status is at baseline.     Cranial Nerves: Cranial nerves 2-12 are intact.     Sensory: Sensation is intact.     Motor: Motor function is intact.     Coordination: Coordination is intact.     Gait: Gait is intact.     Deep Tendon Reflexes: Reflexes are normal and symmetric.  Psychiatric:        Attention and Perception: Attention normal.        Mood and Affect: Mood normal.        Speech: Speech normal.        Behavior: Behavior normal. Behavior is cooperative.        Thought Content: Thought content normal.         Cognition and Memory: Cognition and memory normal.        Judgment: Judgment normal.      Most recent functional status assessment:    10/26/2022    2:09 PM  In your present state of health, do you have any difficulty performing the following activities:  Hearing? 1  Vision? 0  Difficulty concentrating or making decisions? 0  Walking or climbing stairs? 0  Dressing or bathing? 0  Doing errands, shopping? 0  Preparing Food and eating ? N  Using the Toilet? N  In the past six months, have you accidently leaked urine? N  Do you have problems with loss of bowel control? N  Managing your Medications? N  Managing your Finances? N  Housekeeping or managing your Housekeeping? N   Most recent fall risk assessment:    10/26/2022    2:11 PM  Fall Risk   Falls in the past year? 0  Number falls in past yr: 0  Injury with Fall? 0  Risk for fall due to : No Fall Risks  Follow up Falls evaluation completed    Most recent depression screenings:    10/26/2022    2:24 PM 10/20/2021   10:07 AM  PHQ 2/9 Scores  PHQ - 2 Score 0 0   Most recent cognitive screening:    10/26/2022    2:14 PM  6CIT Screen  What Year? 0 points  What month? 0 points  What time? 0 points  Count back from 20 0 points  Months in reverse 0 points  Repeat phrase 2 points  Total Score 2 points     Results:   Studies obtained and personally reviewed by me:  Colonoscopy last completed 02/02/21. Showed eight 1-7 mm polyps in descending, transverse, ascending colon, cecum, diverticulosis in sigmoid colon, internal hemorrhoids.  Pathology showed 8 tubular adenomas. Repeat recommended in 2026.  Labs:       Component Value Date/Time   NA 140 10/21/2022 0927   K 4.4 10/21/2022 0927   CL 103 10/21/2022 0927   CO2 28 10/21/2022 0927   GLUCOSE 98 10/21/2022 0927   BUN 15 10/21/2022 0927   CREATININE 0.91 10/21/2022 0927   CALCIUM 8.9 10/21/2022 0927   PROT 6.4 10/21/2022 0927   ALBUMIN 3.4 (L)  09/08/2016 1114   AST 23 10/21/2022 0927   ALT 23 10/21/2022 0927   ALKPHOS 63 09/08/2016 1114   BILITOT 0.5 10/21/2022 0927   GFRNONAA 85 08/10/2019 0943   GFRAA 98 08/10/2019 0943     Lab Results  Component Value Date   WBC 8.2 10/21/2022   HGB 15.3 10/21/2022   HCT 46.7 10/21/2022   MCV 98.1 10/21/2022   PLT 181 10/21/2022    Lab Results  Component Value Date   CHOL 187 10/21/2022   HDL 75 10/21/2022   LDLCALC 92 10/21/2022   TRIG 102 10/21/2022   CHOLHDL 2.5 10/21/2022    Lab Results  Component Value Date   HGBA1C 6.2 (H) 10/21/2022     No results found for: "TSH"   Lab Results  Component Value Date   PSA 0.37 10/21/2022   PSA 0.34 10/19/2021   PSA 0.30 10/16/2020    Assessment & Plan:   His general health is excellent.  Hyperlipidemia: treated with rosuvastatin 10 mg daily. Lipid panel normal today.  GERD: stable with omeprazole 40 mg daily.  Chronic rhinitis / asthma / seasonal allergies: treated with Allegra, Flonase, Singulair, dupilumab 300 mg injection every two weeks, Dulera inhaler.  Depression: stable with bupropion 150 mg daily.   Impaired glucose tolerance: start metformin 500 mg twice daily. HGBA1c at 6.2% on 10/21/22, up from 6.1% on 10/19/21.   Eosinophilia: eosinophil count low at 1,287.  BPH: seen by Dr. Mena Goes. Taking dutesteride-tamsulosin.   History of hemorrhoidal tags and is seeing Dr. Carman Ching.   Past medical history: Appendectomy 1955, thyroglossal duct cyst removed in 1998. Hernia repair 2009.   No known drug allergies.   History of bilateral cataract extractions in [place] in July 2021.   He has sensorineural hearing loss right ear with restricted hearing on the left.  Given levofloxacin for upcoming travel to [place].  Urinalysis normal today.  Colonoscopy last completed 02/02/21. Showed eight 1-7 mm polyps in descending, transverse, ascending colon, cecum, diverticulosis in sigmoid colon, internal  hemorrhoids. Pathology showed 8 tubular adenomas. Repeat recommended in 2026.  Vaccine counseling: UTD on tetanus, shingles vaccines. He will return for a flu booster.  Return in 1 year or as needed.     Annual wellness visit done today including the all of the following: Reviewed patient's Family Medical History Reviewed and updated list of patient's medical providers Assessment of cognitive impairment was done Assessed patient's functional ability Established a written schedule for health screening services Health Risk Assessent Completed and Reviewed  Discussed health benefits of physical activity, and encouraged him to engage in regular exercise appropriate for his age and condition.        I,Alexander Ruley,acting as a Neurosurgeon for Margaree Mackintosh, MD.,have documented all relevant documentation on the behalf of Margaree Mackintosh, MD,as directed by  Margaree Mackintosh, MD while in the presence of Margaree Mackintosh, MD.   I, Margaree Mackintosh, MD, have reviewed all documentation for this visit. The documentation on 11/04/22 for  the exam, diagnosis, procedures, and orders are all accurate and complete.

## 2022-10-21 ENCOUNTER — Other Ambulatory Visit: Payer: Medicare Other

## 2022-10-21 DIAGNOSIS — R7302 Impaired glucose tolerance (oral): Secondary | ICD-10-CM

## 2022-10-21 DIAGNOSIS — E7800 Pure hypercholesterolemia, unspecified: Secondary | ICD-10-CM

## 2022-10-21 DIAGNOSIS — Z125 Encounter for screening for malignant neoplasm of prostate: Secondary | ICD-10-CM

## 2022-10-21 DIAGNOSIS — G609 Hereditary and idiopathic neuropathy, unspecified: Secondary | ICD-10-CM

## 2022-10-21 DIAGNOSIS — G4733 Obstructive sleep apnea (adult) (pediatric): Secondary | ICD-10-CM

## 2022-10-21 DIAGNOSIS — Z0000 Encounter for general adult medical examination without abnormal findings: Secondary | ICD-10-CM

## 2022-10-22 LAB — LIPID PANEL
Cholesterol: 187 mg/dL (ref <200)
HDL: 75 mg/dL (ref >=40)
LDL Cholesterol (Calc): 92 mg/dL
Non-HDL Cholesterol (Calc): 112 mg/dL (ref <130)
Total CHOL/HDL Ratio: 2.5 (calc) (ref <5.0)
Triglycerides: 102 mg/dL (ref <150)

## 2022-10-22 LAB — COMPLETE METABOLIC PANEL WITH GFR
AG Ratio: 1.7 (calc) (ref 1.0–2.5)
ALT: 23 U/L (ref 9–46)
AST: 23 U/L (ref 10–35)
Albumin: 4 g/dL (ref 3.6–5.1)
Alkaline phosphatase (APISO): 86 U/L (ref 35–144)
BUN: 15 mg/dL (ref 7–25)
CO2: 28 mmol/L (ref 20–32)
Calcium: 8.9 mg/dL (ref 8.6–10.3)
Chloride: 103 mmol/L (ref 98–110)
Creat: 0.91 mg/dL (ref 0.70–1.28)
Globulin: 2.4 g/dL (ref 1.9–3.7)
Glucose, Bld: 98 mg/dL (ref 65–99)
Potassium: 4.4 mmol/L (ref 3.5–5.3)
Sodium: 140 mmol/L (ref 135–146)
Total Bilirubin: 0.5 mg/dL (ref 0.2–1.2)
Total Protein: 6.4 g/dL (ref 6.1–8.1)
eGFR: 86 mL/min/{1.73_m2} (ref >=60)

## 2022-10-22 LAB — PSA: PSA: 0.37 ng/mL (ref <=4.00)

## 2022-10-22 LAB — CBC WITH DIFFERENTIAL/PLATELET
Absolute Lymphocytes: 1820 {cells}/uL (ref 850–3900)
Absolute Monocytes: 664 {cells}/uL (ref 200–950)
Basophils Absolute: 33 {cells}/uL (ref 0–200)
Basophils Relative: 0.4 %
Eosinophils Absolute: 1287 {cells}/uL — ABNORMAL HIGH (ref 15–500)
Eosinophils Relative: 15.7 %
HCT: 46.7 % (ref 38.5–50.0)
Hemoglobin: 15.3 g/dL (ref 13.2–17.1)
MCH: 32.1 pg (ref 27.0–33.0)
MCHC: 32.8 g/dL (ref 32.0–36.0)
MCV: 98.1 fL (ref 80.0–100.0)
MPV: 11.4 fL (ref 7.5–12.5)
Monocytes Relative: 8.1 %
Neutro Abs: 4395 {cells}/uL (ref 1500–7800)
Neutrophils Relative %: 53.6 %
Platelets: 181 10*3/uL (ref 140–400)
RBC: 4.76 10*6/uL (ref 4.20–5.80)
RDW: 11.8 % (ref 11.0–15.0)
Total Lymphocyte: 22.2 %
WBC: 8.2 10*3/uL (ref 3.8–10.8)

## 2022-10-22 LAB — MICROALBUMIN / CREATININE URINE RATIO
Creatinine, Urine: 98 mg/dL (ref 20–320)
Microalb, Ur: <0.2 mg/dL

## 2022-10-22 LAB — HEMOGLOBIN A1C
Hgb A1c MFr Bld: 6.2 %{Hb} — ABNORMAL HIGH (ref <5.7)
Mean Plasma Glucose: 131 mg/dL
eAG (mmol/L): 7.3 mmol/L

## 2022-10-26 ENCOUNTER — Encounter: Payer: Self-pay | Admitting: Internal Medicine

## 2022-10-26 ENCOUNTER — Ambulatory Visit: Payer: Medicare Other | Admitting: Internal Medicine

## 2022-10-26 VITALS — BP 130/68 | HR 60 | Ht 72.0 in | Wt 196.0 lb

## 2022-10-26 DIAGNOSIS — N401 Enlarged prostate with lower urinary tract symptoms: Secondary | ICD-10-CM

## 2022-10-26 DIAGNOSIS — E7800 Pure hypercholesterolemia, unspecified: Secondary | ICD-10-CM

## 2022-10-26 DIAGNOSIS — Z0000 Encounter for general adult medical examination without abnormal findings: Secondary | ICD-10-CM

## 2022-10-26 DIAGNOSIS — Z8659 Personal history of other mental and behavioral disorders: Secondary | ICD-10-CM

## 2022-10-26 DIAGNOSIS — G4733 Obstructive sleep apnea (adult) (pediatric): Secondary | ICD-10-CM

## 2022-10-26 DIAGNOSIS — R7302 Impaired glucose tolerance (oral): Secondary | ICD-10-CM

## 2022-10-26 DIAGNOSIS — R351 Nocturia: Secondary | ICD-10-CM

## 2022-10-26 DIAGNOSIS — Z8709 Personal history of other diseases of the respiratory system: Secondary | ICD-10-CM | POA: Diagnosis not present

## 2022-10-26 DIAGNOSIS — G609 Hereditary and idiopathic neuropathy, unspecified: Secondary | ICD-10-CM

## 2022-10-26 LAB — POCT URINALYSIS DIP (CLINITEK)
Bilirubin, UA: NEGATIVE
Blood, UA: NEGATIVE
Glucose, UA: NEGATIVE mg/dL
Ketones, POC UA: NEGATIVE mg/dL
Leukocytes, UA: NEGATIVE
Nitrite, UA: NEGATIVE
POC PROTEIN,UA: NEGATIVE
Spec Grav, UA: 1.01 (ref 1.010–1.025)
Urobilinogen, UA: 0.2 U/dL
pH, UA: 6 (ref 5.0–8.0)

## 2022-10-26 MED ORDER — LEVOFLOXACIN 500 MG PO TABS: 500.0000 mg | ORAL_TABLET | Freq: Every day | ORAL | 0 refills | Status: AC

## 2022-10-26 MED ORDER — METFORMIN HCL 500 MG PO TABS: 500.0000 mg | ORAL_TABLET | Freq: Two times a day (BID) | ORAL | 3 refills | Status: AC

## 2022-11-04 NOTE — Patient Instructions (Addendum)
It was a pleasure to see you today. Vaccine discussed. Labs are stable. Given Levaquin for upcoming trip to [place]. Chronic sinusitis and rhinitis issues are stable. Return in one year or as needed. He needs flu vaccine.

## 2023-01-15 ENCOUNTER — Other Ambulatory Visit: Payer: Self-pay | Admitting: Internal Medicine

## 2023-01-15 DIAGNOSIS — J1289 Other viral pneumonia: Secondary | ICD-10-CM

## 2023-01-23 ENCOUNTER — Other Ambulatory Visit: Payer: Self-pay | Admitting: Internal Medicine

## 2023-03-19 ENCOUNTER — Other Ambulatory Visit: Payer: Self-pay | Admitting: Internal Medicine

## 2023-08-02 ENCOUNTER — Other Ambulatory Visit: Payer: Self-pay | Admitting: Internal Medicine

## 2023-08-02 MED ORDER — BUPROPION HCL ER (XL) 150 MG PO TB24: 150.0000 mg | ORAL_TABLET | Freq: Every day | ORAL | 1 refills | Status: DC

## 2023-08-02 MED ORDER — DUTASTERIDE-TAMSULOSIN HCL 0.5-0.4 MG PO CAPS: 1.0000 | ORAL_CAPSULE | Freq: Every day | ORAL | 1 refills | Status: DC

## 2023-08-02 NOTE — Telephone Encounter (Signed)
 Copied from CRM [id number]. Topic: Clinical - Medication Refill >> Aug 02, 2023 10:23 AM Deleta S wrote: Medication: dutasteride - tamsulosin  0.5-0.4 Mg, bupropion  150 Mg   Patient would like for these to be written for 90 days if possible   Has the patient contacted their pharmacy? Yes (Agent: If no, request that the patient contact the pharmacy for the refill. If patient does not wish to contact the pharmacy document the reason why and proceed with request.) (Agent: If yes, when and what did the pharmacy advise?)  This is the patient's preferred pharmacy:  [id number] [address], [place] - [address] [place] [address] Phone: [phone] Fax: [phone]    Is this the correct pharmacy for this prescription? Yes If no, delete pharmacy and type the correct one.   Has the prescription been filled recently? No  Is the patient out of the medication? No  Has the patient been seen for an appointment in the last year OR does the patient have an upcoming appointment? Yes  Can we respond through MyChart? No  Agent: Please be advised that Rx refills may take up to 3 business days. We ask that you follow-up with your pharmacy.

## 2024-01-10 ENCOUNTER — Other Ambulatory Visit

## 2024-01-10 DIAGNOSIS — R7302 Impaired glucose tolerance (oral): Secondary | ICD-10-CM

## 2024-01-10 DIAGNOSIS — E7800 Pure hypercholesterolemia, unspecified: Secondary | ICD-10-CM

## 2024-01-10 DIAGNOSIS — Z0000 Encounter for general adult medical examination without abnormal findings: Secondary | ICD-10-CM

## 2024-01-10 DIAGNOSIS — Z125 Encounter for screening for malignant neoplasm of prostate: Secondary | ICD-10-CM

## 2024-01-11 LAB — LIPID PANEL
Cholesterol: 214 mg/dL — ABNORMAL HIGH
HDL: 91 mg/dL
LDL Cholesterol (Calc): 104 mg/dL — ABNORMAL HIGH
Non-HDL Cholesterol (Calc): 123 mg/dL
Total CHOL/HDL Ratio: 2.4 (calc)
Triglycerides: 96 mg/dL

## 2024-01-11 LAB — COMPREHENSIVE METABOLIC PANEL WITH GFR
AG Ratio: 1.6 (calc) (ref 1.0–2.5)
ALT: 24 U/L (ref 9–46)
AST: 21 U/L (ref 10–35)
Albumin: 4.2 g/dL (ref 3.6–5.1)
Alkaline phosphatase (APISO): 81 U/L (ref 35–144)
BUN: 20 mg/dL (ref 7–25)
CO2: 30 mmol/L (ref 20–32)
Calcium: 9.5 mg/dL (ref 8.6–10.3)
Chloride: 100 mmol/L (ref 98–110)
Creat: 0.91 mg/dL (ref 0.70–1.22)
Globulin: 2.6 g/dL (ref 1.9–3.7)
Glucose, Bld: 104 mg/dL — ABNORMAL HIGH (ref 65–99)
Potassium: 4.9 mmol/L (ref 3.5–5.3)
Sodium: 139 mmol/L (ref 135–146)
Total Bilirubin: 0.5 mg/dL (ref 0.2–1.2)
Total Protein: 6.8 g/dL (ref 6.1–8.1)
eGFR: 85 mL/min/1.73m2

## 2024-01-11 LAB — CBC WITH DIFFERENTIAL/PLATELET
Absolute Lymphocytes: 1690 {cells}/uL (ref 850–3900)
Absolute Monocytes: 640 {cells}/uL (ref 200–950)
Basophils Absolute: 10 {cells}/uL (ref 0–200)
Basophils Relative: 0.1 %
Eosinophils Absolute: 10 {cells}/uL — ABNORMAL LOW (ref 15–500)
Eosinophils Relative: 0.1 %
HCT: 46.6 % (ref 39.4–51.1)
Hemoglobin: 15.1 g/dL (ref 13.2–17.1)
MCH: 31.3 pg (ref 27.0–33.0)
MCHC: 32.4 g/dL (ref 31.6–35.4)
MCV: 96.7 fL (ref 81.4–101.7)
MPV: 11.3 fL (ref 7.5–12.5)
Monocytes Relative: 6.4 %
Neutro Abs: 7650 {cells}/uL (ref 1500–7800)
Neutrophils Relative %: 76.5 %
Platelets: 203 Thousand/uL (ref 140–400)
RBC: 4.82 Million/uL (ref 4.20–5.80)
RDW: 11.8 % (ref 11.0–15.0)
Total Lymphocyte: 16.9 %
WBC: 10 Thousand/uL (ref 3.8–10.8)

## 2024-01-11 LAB — HEMOGLOBIN A1C
Hgb A1c MFr Bld: 5.9 % — ABNORMAL HIGH
Mean Plasma Glucose: 123 mg/dL
eAG (mmol/L): 6.8 mmol/L

## 2024-01-11 LAB — MICROALBUMIN / CREATININE URINE RATIO
Creatinine, Urine: 94 mg/dL (ref 20–320)
Microalb, Ur: <0.2 mg/dL

## 2024-01-11 LAB — PSA: PSA: 1.81 ng/mL

## 2024-01-12 NOTE — Progress Notes (Signed)
 "  Annual Wellness Visit   Patient Care Team: Perri Ronal PARAS, MD as PCP - General (Internal Medicine)  Visit Date: 01/20/24   Chief Complaint  Patient presents with   Annual Exam   Medicare Wellness   Subjective:  Patient: Jeffrey Wu., Male DOB: 02/21/1945, 79 y.o. MRN: [id number] Vitals:   01/20/24 0951  BP: 120/80   Jeffrey Wu. is a 79 y.o. Male who presents today for his Annual Wellness Visit. Patient has Hyperlipidemia; OSA (obstructive sleep apnea); Allergic rhinitis; BPH (benign prostatic hyperplasia); Hereditary and idiopathic peripheral neuropathy; Impaired glucose tolerance; and Senile purpura on their problem list.  History of hyperlipidemia treated with rosuvastatin  10 mg daily. 01/10/2024 Lipid Panel CHOL 214, LDL 104, Otherwise WNL.    History of impaired glucose tolerance. 01/10/2024 HgbA1c of 5.9%. He said that he has previously tried to metformin  but discontinued use due to side effects. He will try to manage with diet and exercise.    History of GE Reflux treated with omeprazole  40 mg daily.     History of chronic rhinitis,  hx of chronic sinusitis,asthma, seasonal allergies treated with Allegra, Flonase, Singulair , dupilumab 300 mg injection every two weeks, Dulera  inhaler.Currently doing well and followed ar [facility].Uses Dulera  inhaler and is on Dupixent.Uses Flonase, Singulair , and allegra. Also takes Prilosec   History of depression treated with Bupropion  150 mg daily.   Saw Dr. Nieves recently regarding BPH and will be seen in 6 months. Has weak stream and BPH. History of sleep apnea.  Has CPAP device but does not use it regularly.  In the fall 2019 he had bilateral ethmoidectomies, frontal sinusotomies and sphenoidotomies performed.  He is followed by [facility] pulmonary department and is on Dupixent.  He used to be on chronic steroid therapy for chronic sinusitis and allergic rhinitis.   History of hemorrhoidal tags and is  seeing Dr. Georgette Poli.   History of bilateral cataract extractions in [place] in July 2021.   Past medical history: Appendectomy 1955, thyroglossal duct cyst removed in 1998.  Hernia repair 2009.   History of  sensorineural hearing loss right ear with restricted hearing on the left. Treated with hearing aids.    History of eosinophilia; 01/10/2024 Absolute eosinophils 10.    No known drug allergies.    In 2015 he had endoscopic sinus surgery at [facility] by Dr. Loria. He had improvement in his sense of smell and decreased nasal drainage. He was then on a prolonged steroid taper. In 2016 he had a complete loss of smell and increased nasal congestion despite aspirin therapy, nasal steroid and leukotriene inhibitor. Endoscopy of the sinuses revealed ethmoid polypoid edema and eosinophilic secretions that were tenacious. He underwent a repeat oral steroid burst and began prednisone  5 mg daily. In the fall 2019 he had bilateral ethmoidectomies, frontal sinusotomies, sphenoidotomies performed.   Labs 01/10/2024 Absolute Eosinophils 10, Blood glucose 104, HgbA1c 5.9%, CHOL 214, LDL 104, Otherwise WNL.   02/02/2021 Colonoscopy Eight 1 to 7 mm polyps in the descending colon, in the transverse colon, in the ascending colon and in the cecum. Resected and retrieved. Diverticulosis in the sigmoid colon. Internal hemorrhoids. The examination was otherwise normal on direct and retroflexion views. Repeat in 7 years.    Health maintenance: Eye exam due.    Vaccine counseling: Covid-19 vaccine due.   Health Maintenance  Topic Date Due   OPHTHALMOLOGY EXAM  08/15/2022   COVID-19 Vaccine (9 -  2025-26 season) 09/05/2023   Medicare Annual Wellness (AWV)  10/26/2023   Colonoscopy  02/03/2024   HEMOGLOBIN A1C  07/09/2024   Diabetic kidney evaluation - eGFR measurement  01/09/2025   Diabetic kidney evaluation - Urine ACR  01/09/2025   FOOT EXAM  01/19/2025   DTaP/Tdap/Td (3 - Td  or Tdap) 07/02/2026   Pneumococcal Vaccine: 50+ Years  Completed   Influenza Vaccine  Completed   Zoster Vaccines- Shingrix  Completed   Meningococcal B Vaccine  Aged Out     Review of Systems  Constitutional:  Negative for fever and malaise/fatigue.  HENT:  Negative for congestion.   Eyes:  Negative for blurred vision.  Respiratory:  Negative for cough and shortness of breath.   Cardiovascular:  Negative for chest pain, palpitations and leg swelling.  Gastrointestinal:  Negative for vomiting.  Musculoskeletal:  Negative for back pain.  Skin:  Negative for rash.  Neurological:  Negative for loss of consciousness and headaches.   Objective:  Vitals: body mass index is 27.4 kg/m. Today's Vitals   01/20/24 0951  BP: 120/80  Pulse: 77  SpO2: 98%  Weight: 202 lb (91.6 kg)  Height: 6' (1.829 m)  PainSc: 5   PainLoc: Foot   Physical Exam Vitals and nursing note reviewed. Exam conducted with a chaperone present (Araceli Mountainhome, CMA).  Constitutional:      General: He is awake. He is not in acute distress.    Appearance: Normal appearance. He is not ill-appearing or toxic-appearing.  HENT:     Head: Normocephalic and atraumatic.     Right Ear: Tympanic membrane, ear canal and external ear normal.     Left Ear: Tympanic membrane, ear canal and external ear normal.     Mouth/Throat:     Pharynx: Oropharynx is clear.  Eyes:     Extraocular Movements: Extraocular movements intact.     Pupils: Pupils are equal, round, and reactive to light.  Neck:     Thyroid: No thyroid mass, thyromegaly or thyroid tenderness.     Vascular: No carotid bruit.  Cardiovascular:     Rate and Rhythm: Normal rate and regular rhythm. No extrasystoles are present.    Pulses:          Dorsalis pedis pulses are 2+ on the right side and 2+ on the left side.       Posterior tibial pulses are 2+ on the right side and 2+ on the left side.     Heart sounds: Normal heart sounds. No murmur heard.    No  friction rub. No gallop.  Pulmonary:     Effort: Pulmonary effort is normal.     Breath sounds: Normal breath sounds. No decreased breath sounds, wheezing, rhonchi or rales.  Chest:     Chest wall: No mass.  Abdominal:     Palpations: Abdomen is soft. There is no hepatomegaly, splenomegaly or mass.     Tenderness: There is no abdominal tenderness.     Hernia: No hernia is present.  Genitourinary:    Prostate: Normal. Not enlarged, not tender and no nodules present.     Rectum: Normal. Guaiac result negative.  Musculoskeletal:     Cervical back: Normal range of motion.     Right lower leg: No edema.     Left lower leg: No edema.  Lymphadenopathy:     Cervical: No cervical adenopathy.     Upper Body:     Right upper body: No supraclavicular adenopathy.  Left upper body: No supraclavicular adenopathy.  Skin:    General: Skin is warm and dry.  Neurological:     General: No focal deficit present.     Mental Status: He is alert and oriented to person, place, and time. Mental status is at baseline.     Cranial Nerves: Cranial nerves 2-12 are intact.     Sensory: Sensation is intact.     Motor: Motor function is intact.     Coordination: Coordination is intact.     Gait: Gait is intact.     Deep Tendon Reflexes: Reflexes are normal and symmetric.  Psychiatric:        Attention and Perception: Attention normal.        Mood and Affect: Mood normal.        Speech: Speech normal.        Behavior: Behavior normal. Behavior is cooperative.        Thought Content: Thought content normal.        Cognition and Memory: Cognition and memory normal.        Judgment: Judgment normal.     Current Outpatient Medications  Medication Instructions   buPROPion  (WELLBUTRIN  XL) 150 mg, Oral, Daily   DULERA  100-5 MCG/ACT AERO INHALE 2 PUFFS BY MOUTH IN THE MORNING & 2 PUFFS IN THE EVENING   Dupixent 300 mg, Every 14 days   Dutasteride -Tamsulosin  HCl 0.5-0.4 MG CAPS 1 tablet, Oral, Daily    fexofenadine (ALLEGRA) 180 mg, Daily   fluticasone (FLONASE) 50 MCG/ACT nasal spray    metFORMIN  (GLUCOPHAGE ) 500 mg, Oral, 2 times daily with meals   montelukast  (SINGULAIR ) 10 mg, Oral, Every evening   omeprazole  (PRILOSEC) 40 mg, Oral, Daily   rosuvastatin  (CRESTOR ) 10 mg, Oral, Daily   Past Medical History:  Diagnosis Date   Asthma    moderate asthma-inhalant =uses inhaler daily   Atopic rhinitis    Cataract    bilateral sx   Chronic sinusitis    Depression    on meds   GERD (gastroesophageal reflux disease)    on meds   Hemorrhoids    Hernia    Hyperlipidemia    on meds   OSA (obstructive sleep apnea)    Seasonal allergies    Sleep apnea    does not use CPAP at this time (01/02/2021)   Medical/Surgical History Narrative:  Allergic/Intolerant to: Allergies[1]  Past Surgical History:  Procedure Laterality Date   APPENDECTOMY     CATARACT EXTRACTION, BILATERAL     COLONOSCOPY  2018   JP-MAC-suprep(fair)tics/hems/TA x 6-3 yr recall   FOOT SURGERY     Left   HERNIA REPAIR     INGUINAL HERNIA REPAIR  04/22/2011   Right   NASAL SINUS SURGERY  12/09/2010   x 3   THYROGLOSSAL DUCT CYST  01/05/1991   Family History  Problem Relation Age of Onset   Heart disease Mother    Stroke Mother    Cancer Father        leukemia   Colon polyps Neg Hx    Colon cancer Neg Hx    Esophageal cancer Neg Hx    Rectal cancer Neg Hx    Stomach cancer Neg Hx    Family History Narrative: Father died at age 55 of leukemia when patient was 68 years old. Mother died at age 8 of a stroke after heart surgery. 1 brother and 1 sister in good health. 3 adult children, 2 sons and a daughter all of  whom are in good health.    Social history: He is married. 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 He quit smoking several years ago but previously smoked a pack of cigarettes daily for some 46 years. Consumes alcohol daily.   Most Recent  Health Risks Assessment:   Most Recent Social Determinants of Health (Including Hx of Tobacco, Alcohol, and Drug Use) SDOH Screenings   Food Insecurity: No Food Insecurity (01/20/2024)  Housing: Low Risk (01/20/2024)  Transportation Needs: No Transportation Needs (01/20/2024)  Utilities: Not At Risk (01/20/2024)  Alcohol Screen: Low Risk (10/26/2022)  Depression (PHQ2-9): Low Risk (01/20/2024)  Financial Resource Strain: High Risk (10/26/2022)  Physical Activity: Inactive (01/20/2024)  Social Connections: Moderately Integrated (01/20/2024)  Stress: No Stress Concern Present (01/20/2024)  Tobacco Use: Medium Risk (01/20/2024)  Health Literacy: Adequate Health Literacy (01/20/2024)   Social History[2]  Most Recent Fall Risk Assessment:    01/20/2024   10:14 AM  Fall Risk   Falls in the past year? 0  Number falls in past yr: 0  Injury with Fall? 0  Risk for fall due to : No Fall Risks  Follow up Falls prevention discussed;Education provided;Falls evaluation completed   Most Recent Anxiety/Depression Screenings:    01/20/2024    9:51 AM 10/26/2022    2:24 PM  PHQ 2/9 Scores  PHQ - 2 Score 0 0  PHQ- 9 Score 2       01/20/2024    9:51 AM  GAD 7 : Generalized Anxiety Score  Nervous, Anxious, on Edge 0  Control/stop worrying 0  Worry too much - different things 0  Trouble relaxing 0  Restless 0  Easily annoyed or irritable 0  Afraid - awful might happen 0  Total GAD 7 Score 0  Anxiety Difficulty Not difficult at all   Most Recent Cognitive Screening:    10/26/2022    2:14 PM  6CIT Screen  What Year? 0 points  What month? 0 points  What time? 0 points  Count back from 20 0 points  Months in reverse 0 points  Repeat phrase 2 points  Total Score 2 points   Most Recent Vision/Hearing Screenings:Vision Screening - Comments:: Tim Coope  Results:  Studies Obtained And Personally Reviewed By Me: Diabetic Foot Exam - Simple   Simple Foot Form Visual Inspection No  deformities, no ulcerations, no other skin breakdown bilaterally: Yes Sensation Testing Intact to touch and monofilament testing bilaterally: Yes Pulse Check Posterior Tibialis and Dorsalis pulse intact bilaterally: Yes Comments Patient c/o of feet pain unable to exercise.     02/02/2021 Colonoscopy Eight 1 to 7 mm polyps in the descending colon, in the transverse colon, in the ascending colon and in the cecum. Resected and retrieved. Diverticulosis in the sigmoid colon. Internal hemorrhoids. The examination was otherwise normal on direct and retroflexion views. Repeat in 7 years.    Labs:  CBC w/ Differential Lab Results  Component Value Date   WBC 10.0 01/10/2024   RBC 4.82 01/10/2024   HGB 15.1 01/10/2024   HCT 46.6 01/10/2024   PLT 203 01/10/2024   MCV 96.7 01/10/2024   MCH 31.3 01/10/2024   MCHC 32.4 01/10/2024   RDW 11.8 01/10/2024   MPV 11.3 01/10/2024   LYMPHSABS 1,778 10/19/2021   MONOABS 730 02/16/2016   BASOSABS 10 01/10/2024    Comprehensive Metabolic Panel Lab Results  Component Value Date   NA 139 01/10/2024   K 4.9  01/10/2024   CL 100 01/10/2024   CO2 30 01/10/2024   GLUCOSE 104 (H) 01/10/2024   BUN 20 01/10/2024   CREATININE 0.91 01/10/2024   CALCIUM  9.5 01/10/2024   PROT 6.8 01/10/2024   ALBUMIN 3.4 (L) 09/08/2016   AST 21 01/10/2024   ALT 24 01/10/2024   ALKPHOS 63 09/08/2016   BILITOT 0.5 01/10/2024   EGFR 85 01/10/2024   GFRNONAA 85 08/10/2019   Lipid Panel  Lab Results  Component Value Date   CHOL 214 (H) 01/10/2024   HDL 91 01/10/2024   LDLCALC 104 (H) 01/10/2024   TRIG 96 01/10/2024   A1c Lab Results  Component Value Date   HGBA1C 5.9 (H) 01/10/2024     PSA 1.81  Assessment & Plan:   Hyperlipidemia: treated with rosuvastatin  10 mg daily. 01/10/2024 Lipid Panel CHOL 214, LDL 104, Otherwise WNL.    Impaired glucose tolerance: 01/10/2024 HgbA1c of 5.9%. He said that he has previously tried to metformin  but discontinued use due  to side effects. He will try to manage with diet and exercise.    GE Reflux: treated with omeprazole  40 mg daily.     Chronic rhinitis, asthma, seasonal allergies: treated with Allegra, Flonase, Singulair , dupilumab 300 mg injection every two weeks, Dulera  inhaler.   Depression: treated with bupropion  150 mg daily.   Sensorineural hearing loss: right ear with restricted hearing on the left. Treated with hearing aids.    Eosinophilia; 01/10/2024 Absolute eosinophils 10.    02/02/2021 Colonoscopy Eight 1 to 7 mm polyps in the descending colon, in the transverse colon, in the ascending colon and in the cecum. Resected and retrieved. Diverticulosis in the sigmoid colon. Internal hemorrhoids. The examination was otherwise normal on direct and retroflexion views. Repeat in 7 years.    Health maintenance: Eye exam due.    Vaccine counseling: Covid-19 vaccine due.    Return in about 1 year (around 01/19/2025).   Annual Wellness Visit done today including the all of the following: Reviewed patient's Family Medical History Reviewed patient's SDOH and reviewed tobacco, alcohol, and drug use.  Reviewed and updated list of patient's medical providers Assessment of cognitive impairment was done Assessed patient's functional ability Established a written schedule for health screening services Health Risk Assessent Completed and Reviewed  Discussed health benefits of physical activity, and encouraged him to engage in regular exercise appropriate for his age and condition.    I,Makayla C Reid,acting as a scribe for Ronal JINNY Hailstone, MD.,have documented all relevant documentation on the behalf of Ronal JINNY Hailstone, MD,as directed by  Ronal JINNY Hailstone, MD while in the presence of Ronal JINNY Hailstone, MD.  I, Ronal JINNY Hailstone, MD, have reviewed all documentation for and agree with the above Annual Wellness Visit documentation.  Ronal JINNY Hailstone, MD Internal Medicine 01/20/2024     [1]  Allergies Allergen Reactions    Aspirin Shortness Of Breath   Ibuprofen Shortness Of Breath and Nausea And Vomiting  [2]  Social History Tobacco Use   Smoking status: Former    Current packs/day: 0.00    Average packs/day: 1 pack/day for 50.0 years (50.0 ttl pk-yrs)    Types: Cigarettes    Start date: 01/04/1957    Quit date: 01/05/2007    Years since quitting: 17.0   Smokeless tobacco: Never  Substance Use Topics   Alcohol use: Yes    Alcohol/week: 21.0 standard drinks of alcohol    Types: 21 Standard drinks or equivalent per week   Drug  use: No   "

## 2024-01-20 ENCOUNTER — Encounter: Payer: Self-pay | Admitting: Internal Medicine

## 2024-01-20 ENCOUNTER — Ambulatory Visit: Admitting: Internal Medicine

## 2024-01-20 VITALS — BP 120/80 | HR 77 | Ht 72.0 in | Wt 202.0 lb

## 2024-01-20 DIAGNOSIS — E785 Hyperlipidemia, unspecified: Secondary | ICD-10-CM

## 2024-01-20 DIAGNOSIS — K219 Gastro-esophageal reflux disease without esophagitis: Secondary | ICD-10-CM | POA: Diagnosis not present

## 2024-01-20 DIAGNOSIS — H905 Unspecified sensorineural hearing loss: Secondary | ICD-10-CM | POA: Diagnosis not present

## 2024-01-20 DIAGNOSIS — F32A Depression, unspecified: Secondary | ICD-10-CM

## 2024-01-20 DIAGNOSIS — Z0001 Encounter for general adult medical examination with abnormal findings: Secondary | ICD-10-CM | POA: Diagnosis not present

## 2024-01-20 DIAGNOSIS — G609 Hereditary and idiopathic neuropathy, unspecified: Secondary | ICD-10-CM

## 2024-01-20 DIAGNOSIS — J310 Chronic rhinitis: Secondary | ICD-10-CM

## 2024-01-20 DIAGNOSIS — J4530 Mild persistent asthma, uncomplicated: Secondary | ICD-10-CM | POA: Diagnosis not present

## 2024-01-20 DIAGNOSIS — Z8709 Personal history of other diseases of the respiratory system: Secondary | ICD-10-CM

## 2024-01-20 DIAGNOSIS — D721 Eosinophilia, unspecified: Secondary | ICD-10-CM

## 2024-01-20 DIAGNOSIS — R7302 Impaired glucose tolerance (oral): Secondary | ICD-10-CM

## 2024-01-20 DIAGNOSIS — Z0000 Encounter for general adult medical examination without abnormal findings: Secondary | ICD-10-CM

## 2024-01-20 DIAGNOSIS — N401 Enlarged prostate with lower urinary tract symptoms: Secondary | ICD-10-CM

## 2024-01-20 DIAGNOSIS — E7800 Pure hypercholesterolemia, unspecified: Secondary | ICD-10-CM

## 2024-01-20 DIAGNOSIS — G4733 Obstructive sleep apnea (adult) (pediatric): Secondary | ICD-10-CM

## 2024-01-20 DIAGNOSIS — Z8659 Personal history of other mental and behavioral disorders: Secondary | ICD-10-CM

## 2024-01-20 NOTE — Progress Notes (Signed)
 "  Chief Complaint  Patient presents with   Annual Exam   Medicare Wellness     Subjective:   Jeffrey Bushey. is a 79 y.o. male who presents for a Medicare Annual Wellness Visit.  Visit info / Clinical Intake: Medicare Wellness Visit Type:: Subsequent Annual Wellness Visit Persons participating in visit and providing information:: patient Medicare Wellness Visit Mode:: In-person (required for WTM) Interpreter Needed?: No Pre-visit prep was completed: yes AWV questionnaire completed by patient prior to visit?: no Living arrangements:: lives with spouse/significant other Patient's Overall Health Status Rating: (!) fair Typical amount of pain: some Does pain affect daily life?: no Are you currently prescribed opioids?: no  Dietary Habits and Nutritional Risks How many meals a day?: 3 Eats fruit and vegetables daily?: yes Most meals are obtained by: preparing own meals In the last 2 weeks, have you had any of the following?: none Diabetic:: no  Functional Status Activities of Daily Living (to include ambulation/medication): Independent Ambulation: Independent Medication Administration: Independent Home Management (perform basic housework or laundry): Independent Manage your own finances?: yes Primary transportation is: driving Concerns about vision?: no *vision screening is required for WTM* Concerns about hearing?: (!) yes Uses hearing aids?: no Hear whispered voice?: yes  Fall Screening Falls in the past year?: 0 Number of falls in past year: 0 Was there an injury with Fall?: 0 Fall Risk Category Calculator: 0 Patient Fall Risk Level: Low Fall Risk  Fall Risk Patient at Risk for Falls Due to: No Fall Risks Fall risk Follow up: Falls prevention discussed; Education provided; Falls evaluation completed  Home and Transportation Safety: All rugs have non-skid backing?: (!) no All stairs or steps have railings?: N/A, no stairs Grab bars in the bathtub or shower?:  yes Have non-skid surface in bathtub or shower?: yes Good home lighting?: yes Regular seat belt use?: yes Hospital stays in the last year:: no  Cognitive Assessment Difficulty concentrating, remembering, or making decisions? : no Will 6CIT or Mini Cog be Completed: no 6CIT or Mini Cog Declined: patient alert, oriented, able to answer questions appropriately and recall recent events  Advance Directives (For Healthcare) Does Patient Have a Medical Advance Directive?: Yes Does patient want to make changes to medical advance directive?: No - Patient declined Type of Advance Directive: Living will; Healthcare Power of Attorney Copy of Healthcare Power of Attorney in Chart?: No - copy requested Copy of Living Will in Chart?: No - copy requested  Reviewed/Updated  Reviewed/Updated: Reviewed All (Medical, Surgical, Family, Medications, Allergies, Care Teams, Patient Goals)    Allergies (verified) Aspirin and Ibuprofen   Current Medications (verified) Outpatient Encounter Medications as of 01/20/2024  Medication Sig   buPROPion  (WELLBUTRIN  XL) 150 MG 24 hr tablet Take 1 tablet (150 mg total) by mouth daily.   DULERA  100-5 MCG/ACT AERO INHALE 2 PUFFS BY MOUTH IN THE MORNING & 2 PUFFS IN THE EVENING   dupilumab (DUPIXENT) 300 MG/2ML prefilled syringe Inject 300 mg into the skin every 14 (fourteen) days.    Dutasteride -Tamsulosin  HCl 0.5-0.4 MG CAPS Take 1 tablet by mouth daily.   fexofenadine (ALLEGRA) 180 MG tablet Take 180 mg by mouth daily.   fluticasone (FLONASE) 50 MCG/ACT nasal spray    metFORMIN  (GLUCOPHAGE ) 500 MG tablet Take 1 tablet (500 mg total) by mouth 2 (two) times daily with a meal.   montelukast  (SINGULAIR ) 10 MG tablet Take 1 tablet (10 mg total) by mouth every evening.   omeprazole  (PRILOSEC) 40 MG capsule TAKE  1 CAPSULE (40 MG TOTAL) BY MOUTH DAILY.   rosuvastatin  (CRESTOR ) 10 MG tablet TAKE 1 TABLET BY MOUTH EVERY DAY   No facility-administered encounter medications  on file as of 01/20/2024.    History: Past Medical History:  Diagnosis Date   Asthma    moderate asthma-inhalant =uses inhaler daily   Atopic rhinitis    Cataract    bilateral sx   Chronic sinusitis    Depression    on meds   GERD (gastroesophageal reflux disease)    on meds   Hemorrhoids    Hernia    Hyperlipidemia    on meds   OSA (obstructive sleep apnea)    Seasonal allergies    Sleep apnea    does not use CPAP at this time (01/02/2021)   Past Surgical History:  Procedure Laterality Date   APPENDECTOMY     CATARACT EXTRACTION, BILATERAL     COLONOSCOPY  2018   JP-MAC-suprep(fair)tics/hems/TA x 6-3 yr recall   FOOT SURGERY     Left   HERNIA REPAIR     INGUINAL HERNIA REPAIR  04/22/2011   Right   NASAL SINUS SURGERY  12/09/2010   x 3   THYROGLOSSAL DUCT CYST  01/05/1991   Family History  Problem Relation Age of Onset   Heart disease Mother    Stroke Mother    Cancer Father        leukemia   Colon polyps Neg Hx    Colon cancer Neg Hx    Esophageal cancer Neg Hx    Rectal cancer Neg Hx    Stomach cancer Neg Hx    Social History   Occupational History   Occupation: retired.   Tobacco Use   Smoking status: Former    Current packs/day: 0.00    Average packs/day: 1 pack/day for 50.0 years (50.0 ttl pk-yrs)    Types: Cigarettes    Start date: 01/04/1957    Quit date: 01/05/2007    Years since quitting: 17.0   Smokeless tobacco: Never  Substance and Sexual Activity   Alcohol use: Yes    Alcohol/week: 21.0 standard drinks of alcohol    Types: 21 Standard drinks or equivalent per week   Drug use: No   Sexual activity: Not on file   Tobacco Counseling Counseling given: No  SDOH Screenings   Food Insecurity: No Food Insecurity (01/20/2024)  Housing: Low Risk (01/20/2024)  Transportation Needs: No Transportation Needs (01/20/2024)  Utilities: Not At Risk (01/20/2024)  Alcohol Screen: Low Risk (01/20/2024)  Depression (PHQ2-9): Low Risk (01/20/2024)   Financial Resource Strain: Low Risk (01/20/2024)  Physical Activity: Inactive (01/20/2024)  Social Connections: Moderately Integrated (01/20/2024)  Stress: No Stress Concern Present (01/20/2024)  Tobacco Use: Medium Risk (01/20/2024)  Health Literacy: Adequate Health Literacy (01/20/2024)   See flowsheets for full screening details  Depression Screen PHQ 2 & 9 Depression Scale- Over the past 2 weeks, how often have you been bothered by any of the following problems? Little interest or pleasure in doing things: 0 Feeling down, depressed, or hopeless (PHQ Adolescent also includes...irritable): 0 PHQ-2 Total Score: 0 Trouble falling or staying asleep, or sleeping too much: 2 Feeling tired or having little energy: 0 Poor appetite or overeating (PHQ Adolescent also includes...weight loss): 0 Feeling bad about yourself - or that you are a failure or have let yourself or your family down: 0 Trouble concentrating on things, such as reading the newspaper or watching television (PHQ Adolescent also includes...like school work): 0 Moving  or speaking so slowly that other people could have noticed. Or the opposite - being so fidgety or restless that you have been moving around a lot more than usual: 0 Thoughts that you would be better off dead, or of hurting yourself in some way: 0 PHQ-9 Total Score: 2 If you checked off any problems, how difficult have these problems made it for you to do your work, take care of things at home, or get along with other people?: Not difficult at all     Goals Addressed               This Visit's Progress     Maintain or improve exercise capacity (6 minute walk) (pt-stated)               Objective:    Today's Vitals   01/20/24 0951  BP: 120/80  Pulse: 77  SpO2: 98%  Weight: 202 lb (91.6 kg)  Height: 6' (1.829 m)  PainSc: 5   PainLoc: Foot   Body mass index is 27.4 kg/m.  Hearing/Vision screen Vision Screening - Comments:: Tim Coope   Immunizations and Health Maintenance Health Maintenance  Topic Date Due   OPHTHALMOLOGY EXAM  08/15/2022   COVID-19 Vaccine (9 - 2025-26 season) 09/05/2023   Colonoscopy  02/03/2024   HEMOGLOBIN A1C  07/09/2024   Diabetic kidney evaluation - eGFR measurement  01/09/2025   Diabetic kidney evaluation - Urine ACR  01/09/2025   FOOT EXAM  01/19/2025   Medicare Annual Wellness (AWV)  01/19/2025   DTaP/Tdap/Td (3 - Td or Tdap) 07/02/2026   Pneumococcal Vaccine: 50+ Years  Completed   Influenza Vaccine  Completed   Zoster Vaccines- Shingrix  Completed   Meningococcal B Vaccine  Aged Out        Assessment/Plan:  This is a routine wellness examination for Jeffrey Wu.  Patient Care Team: Perri Ronal PARAS, MD as PCP - General (Internal Medicine)  I have personally reviewed and noted the following in the patients chart:   Medical and social history Use of alcohol, tobacco or illicit drugs  Current medications and supplements including opioid prescriptions. Functional ability and status Nutritional status Physical activity Advanced directives List of other physicians Hospitalizations, surgeries, and ER visits in previous 12 months Vitals Screenings to include cognitive, depression, and falls Referrals and appointments  No orders of the defined types were placed in this encounter.  In addition, I have reviewed and discussed with patient certain preventive protocols, quality metrics, and best practice recommendations. A written personalized care plan for preventive services as well as general preventive health recommendations were provided to patient.   Araceli Valencia, CMA   01/20/2024   Return in about 1 year (around 01/19/2025).  After Visit Summary: (In Person-Printed) AVS printed and given to the patient  Nurse Notes: None   I, Ronal PARAS Perri, MD, have reviewed all documentation for this visit. The documentation on 01/20/2024 for the exam, diagnosis, procedures, and orders are  all accurate and complete.  "

## 2024-01-20 NOTE — Patient Instructions (Signed)
 Jeffrey Wu,  Thank you for taking the time for your Medicare Wellness Visit. I appreciate your continued commitment to your health goals. Please review the care plan we discussed, and feel free to reach out if I can assist you further.  Please note that Annual Wellness Visits do not include a physical exam. Some assessments may be limited, especially if the visit was conducted virtually. If needed, we may recommend an in-person follow-up with your provider.  Ongoing Care Seeing your primary care provider every 3 to 6 months helps us  monitor your health and provide consistent, personalized care.   Referrals If a referral was made during today's visit and you haven't received any updates within two weeks, please contact the referred provider directly to check on the status.  Recommended Screenings:  Health Maintenance  Topic Date Due   Eye exam for diabetics  08/15/2022   COVID-19 Vaccine (9 - 2025-26 season) 09/05/2023   Complete foot exam   10/26/2023   Medicare Annual Wellness Visit  10/26/2023   Colon Cancer Screening  02/03/2024   Hemoglobin A1C  07/09/2024   Yearly kidney function blood test for diabetes  01/09/2025   Kidney health urinalysis for diabetes  01/09/2025   DTaP/Tdap/Td vaccine (3 - Td or Tdap) 07/02/2026   Pneumococcal Vaccine for age over 49  Completed   Flu Shot  Completed   Zoster (Shingles) Vaccine  Completed   Meningitis B Vaccine  Aged Out       01/20/2024   10:14 AM  Advanced Directives  Does Patient Have a Medical Advance Directive? Yes  Type of Advance Directive Living will;Healthcare Power of Attorney  Does patient want to make changes to medical advance directive? No - Patient declined  Copy of Healthcare Power of Attorney in Chart? No - copy requested    Vision: Annual vision screenings are recommended for early detection of glaucoma, cataracts, and diabetic retinopathy. These exams can also reveal signs of chronic conditions such as diabetes and  high blood pressure.  Dental: Annual dental screenings help detect early signs of oral cancer, gum disease, and other conditions linked to overall health, including heart disease and diabetes.  Please see the attached documents for additional preventive care recommendations.

## 2024-02-01 ENCOUNTER — Other Ambulatory Visit: Payer: Self-pay | Admitting: Internal Medicine

## 2024-02-10 ENCOUNTER — Telehealth: Payer: Self-pay | Admitting: Internal Medicine

## 2024-02-10 ENCOUNTER — Other Ambulatory Visit: Payer: Self-pay | Admitting: Internal Medicine

## 2024-02-10 DIAGNOSIS — J1289 Other viral pneumonia: Secondary | ICD-10-CM

## 2024-02-10 MED ORDER — DUTASTERIDE-TAMSULOSIN HCL 0.5-0.4 MG PO CAPS: 1.0000 | ORAL_CAPSULE | Freq: Every day | ORAL | 1 refills | Status: AC

## 2024-02-10 NOTE — Telephone Encounter (Signed)
 Refilled Dutasteride -Tamsulosin  to [facility] [address]
